# Patient Record
Sex: Male | Born: 1940 | Race: White | Hispanic: No | Marital: Single | Smoking: Former smoker
Health system: Southern US, Community
[De-identification: ages and names within clinical notes are randomized; demographics above are authoritative.]

## PROBLEM LIST (undated history)

## (undated) DIAGNOSIS — I499 Cardiac arrhythmia, unspecified: Secondary | ICD-10-CM

## (undated) DIAGNOSIS — N400 Enlarged prostate without lower urinary tract symptoms: Secondary | ICD-10-CM

## (undated) DIAGNOSIS — K219 Gastro-esophageal reflux disease without esophagitis: Secondary | ICD-10-CM

## (undated) DIAGNOSIS — F32A Depression, unspecified: Secondary | ICD-10-CM

## (undated) DIAGNOSIS — F40240 Claustrophobia: Secondary | ICD-10-CM

## (undated) DIAGNOSIS — H9190 Unspecified hearing loss, unspecified ear: Secondary | ICD-10-CM

## (undated) DIAGNOSIS — F419 Anxiety disorder, unspecified: Secondary | ICD-10-CM

## (undated) DIAGNOSIS — I10 Essential (primary) hypertension: Secondary | ICD-10-CM

## (undated) DIAGNOSIS — F17200 Nicotine dependence, unspecified, uncomplicated: Secondary | ICD-10-CM

## (undated) DIAGNOSIS — E785 Hyperlipidemia, unspecified: Secondary | ICD-10-CM

## (undated) DIAGNOSIS — I739 Peripheral vascular disease, unspecified: Secondary | ICD-10-CM

## (undated) DIAGNOSIS — N529 Male erectile dysfunction, unspecified: Secondary | ICD-10-CM

## (undated) HISTORY — DX: Depression, unspecified: F32.A

## (undated) HISTORY — DX: Benign prostatic hyperplasia without lower urinary tract symptoms: N40.0

## (undated) HISTORY — DX: Nicotine dependence, unspecified, uncomplicated: F17.200

## (undated) HISTORY — DX: Peripheral vascular disease, unspecified: I73.9

## (undated) HISTORY — DX: Hyperlipidemia, unspecified: E78.5

## (undated) HISTORY — DX: Unspecified hearing loss, unspecified ear: H91.90

## (undated) HISTORY — DX: Gastro-esophageal reflux disease without esophagitis: K21.9

## (undated) HISTORY — DX: Anxiety disorder, unspecified: F41.9

## (undated) HISTORY — DX: Male erectile dysfunction, unspecified: N52.9

## (undated) HISTORY — DX: Essential (primary) hypertension: I10

---

## 2019-02-15 DIAGNOSIS — I714 Abdominal aortic aneurysm, without rupture, unspecified: Secondary | ICD-10-CM | POA: Insufficient documentation

## 2021-02-07 DIAGNOSIS — R0602 Shortness of breath: Secondary | ICD-10-CM | POA: Insufficient documentation

## 2021-02-28 DIAGNOSIS — I471 Supraventricular tachycardia: Secondary | ICD-10-CM | POA: Insufficient documentation

## 2021-03-13 ENCOUNTER — Telehealth: Payer: Self-pay

## 2021-03-13 NOTE — Telephone Encounter (Signed)
Notes on file.  

## 2021-03-25 ENCOUNTER — Encounter: Payer: Self-pay | Admitting: Cardiology

## 2021-03-25 ENCOUNTER — Ambulatory Visit (INDEPENDENT_AMBULATORY_CARE_PROVIDER_SITE_OTHER): Payer: Medicare PPO | Admitting: Cardiology

## 2021-03-25 ENCOUNTER — Other Ambulatory Visit: Payer: Self-pay

## 2021-03-25 ENCOUNTER — Ambulatory Visit (INDEPENDENT_AMBULATORY_CARE_PROVIDER_SITE_OTHER): Payer: Medicare PPO

## 2021-03-25 VITALS — BP 184/78 | HR 71 | Ht 68.0 in | Wt 137.0 lb

## 2021-03-25 DIAGNOSIS — I471 Supraventricular tachycardia: Secondary | ICD-10-CM

## 2021-03-25 DIAGNOSIS — R0602 Shortness of breath: Secondary | ICD-10-CM

## 2021-03-25 MED ORDER — FUROSEMIDE 20 MG PO TABS: 20.0000 mg | ORAL_TABLET | Freq: Every day | ORAL | 0 refills | Status: DC

## 2021-03-25 NOTE — Progress Notes (Unsigned)
Patient enrolled for [employer] to mail a 14 day ZIO XT long term holter monitor to address on file. 

## 2021-03-25 NOTE — Progress Notes (Signed)
Electrophysiology Office Note   Date:  03/25/2021   ID:  Pride, Gonzales 09/30/1941, MRN [id number]  PCP:  Marinell Blight, PA-C  Cardiologist:   Primary Electrophysiologist:  Kristian Mogg Jorja Loa, MD    Chief Complaint: SVT   History of Present Illness: Austin Lamb is a 80 y.o. male who is being seen today for the evaluation of SVT at the request of No ref. provider found. Presenting today for electrophysiology evaluation.  He has a history of hypertension, hyperlipidemia, peripheral vascular disease.  He has had multiple episodes of SVT.  He presented emergency room 02/25/2021 with episodes of SVT.    Today, he denies symptoms of palpitations, chest pain,  orthopnea, PND, lower extremity edema, claudication, dizziness, presyncope, syncope, bleeding, or neurologic sequela. The patient is tolerating medications without difficulties.  Since approximately April 1, he has felt quite poorly with fatigue, shortness of breath, and weakness.  He is also has some lower extremity edema.  He does not have PND nor orthopnea.  Prior to this, he was able to do all of his daily activities, working hard outside.  Since then, he has been quite tired and fatigued.  He states that he had pneumonia   Past Medical History:  Diagnosis Date   Anxiety    BPH (benign prostatic hyperplasia)    Depression    ED (erectile dysfunction)    GERD (gastroesophageal reflux disease)    Hearing loss    Hyperlipidemia    Hypertension    PVD (peripheral vascular disease) (HCC)    Tobacco dependence syndrome    Past Surgical History:  Procedure Laterality Date   cardiovasular stress test  11/24/2007   COLONOSCOPY  2007     Current Outpatient Medications  Medication Sig Dispense Refill   albuterol (VENTOLIN HFA) 108 (90 Base) MCG/ACT inhaler Inhale 2 puffs into the lungs every 6 (six) hours as needed for shortness of breath or wheezing.     cilostazol (PLETAL) 100 MG tablet Take 1 tablet by  mouth 2 (two) times daily.     diazepam (VALIUM) 10 MG tablet Take 1 tablet by mouth every 12 (twelve) hours as needed.     furosemide (LASIX) 20 MG tablet Take 1 tablet (20 mg total) by mouth daily. 3 tablet 0   nebivolol (BYSTOLIC) 5 MG tablet Take 1 tablet by mouth daily.     pravastatin (PRAVACHOL) 40 MG tablet Take 1 tablet by mouth daily.     predniSONE (DELTASONE) 20 MG tablet Take two tabs po QAM x 5 days then one tablet po QAM x 5 days     No current facility-administered medications for this visit.    Allergies:   Codeine   Social History:  The patient  reports that he quit smoking about 3 years ago. His smoking use included cigarettes. He has a 3.30 pack-year smoking history. He has never used smokeless tobacco. He reports that he does not drink alcohol and does not use drugs.   Family History:  The patient's family history includes Cancer in his mother; Stroke in his father.    ROS:  Please see the history of present illness.   Otherwise, review of systems is positive for none.   All other systems are reviewed and negative.    PHYSICAL EXAM: VS:  BP (!) 184/78   Pulse 71   Ht 5\' 8"  (1.727 m)   Wt 137 lb (62.1 kg)   BMI 20.83 kg/m  , BMI Body mass  index is 20.83 kg/m. GEN: Well nourished, well developed, in no acute distress  HEENT: normal  Neck: no JVD, carotid bruits, or masses Cardiac: RRR; no murmurs, rubs, or gallops, 1-2+ edema  Respiratory:  clear to auscultation bilaterally, normal work of breathing GI: soft, nontender, nondistended, + BS MS: no deformity or atrophy  Skin: warm and dry Neuro:  Strength and sensation are intact Psych: euthymic mood, full affect  EKG:  EKG is ordered today. Personal review of the ekg ordered shows sinus rhythm, rate 71, anterior Q waves  Recent Labs: No results found for requested labs within last 8760 hours.    Lipid Panel  No results found for: CHOL, TRIG, HDL, CHOLHDL, VLDL, LDLCALC, LDLDIRECT   Wt Readings from  Last 3 Encounters:  03/25/21 137 lb (62.1 kg)      Other studies Reviewed: Additional studies/ records that were reviewed today include: Epic notes  ASSESSMENT AND PLAN:  1.  SVT: Patient had emergency room visits for SVT. All the ECGs we received from his emergency room visit sinus rhythm and sinus tachycardia.  We Haward Pope have him wear a monitor for the next 2 weeks to see if we can diagnose arrhythmia.  2.  Shortness of breath: Patient has been short of breath since April 1.  He apparently had pneumonia around that time.  He has very poor R wave progression in anterior Q waves.  We Yee Joss order a transthoracic echo.  He also has some lower extremity edema.  We Addisynn Vassell order a BMP 20 mg of Lasix to take daily for the next 3 days.  Current medicines are reviewed at length with the patient today.   The patient does not have concerns regarding his medicines.  The following changes were made today: Lasix x3 days  Labs/ tests ordered today include:  Orders Placed This Encounter  Procedures   LONG TERM MONITOR (3-14 DAYS)   EKG 12-Lead   ECHOCARDIOGRAM COMPLETE     Disposition:   FU with Constance Whittle 3 months  Signed, Kalen Ratajczak Jorja Loa, MD  03/25/2021 10:34 AM     [facility] [address] [place] [address] [phone] (office) [phone] (fax)

## 2021-03-25 NOTE — Patient Instructions (Signed)
Medication Instructions:  Your physician has recommended you make the following change in your medication:  TAKE Lasix 20 mg once daily for 3 days, then stop  *If you need a refill on your cardiac medications before your next appointment, please call your pharmacy*   Lab Work: None ordered If you have labs (blood work) drawn today and your tests are completely normal, you will receive your results only by: MyChart Message (if you have MyChart) OR A paper copy in the mail If you have any lab test that is abnormal or we need to change your treatment, we will call you to review the results.   Testing/Procedures: Your physician has requested that you have an echocardiogram. Echocardiography is a painless test that uses sound waves to create images of your heart. It provides your doctor with information about the size and shape of your heart and how well your heart's chambers and valves are working. This procedure takes approximately one hour. There are no restrictions for this procedure.  Your physician has recommended that you wear a 2 week holter monitor. Holter monitors are medical devices that record the heart's electrical activity. Doctors most often use these monitors to diagnose arrhythmias. Arrhythmias are problems with the speed or rhythm of the heartbeat. The monitor is a small, portable device. You can wear one while you do your normal daily activities. This is usually used to diagnose what is causing palpitations/syncope (passing out). This will be mailed to your home address.  Please review instructions below located under "other instructions".   Follow-Up: At [facility], you and your health needs are our priority.  As part of our continuing mission to provide you with exceptional heart care, we have created designated Provider Care Teams.  These Care Teams include your primary Cardiologist (physician) and Advanced Practice Providers (APPs -  Physician Assistants and Nurse  Practitioners) who all work together to provide you with the care you need, when you need it.  We recommend signing up for the patient portal called "MyChart".  Sign up information is provided on this After Visit Summary.  MyChart is used to connect with patients for Virtual Visits (Telemedicine).  Patients are able to view lab/test results, encounter notes, upcoming appointments, etc.  Non-urgent messages can be sent to your provider as well.   To learn more about what you can do with MyChart, go to [email or web address].    Your next appointment:   3 month(s)  The format for your next appointment:   In Person  Provider:   Loman Brooklyn, MD    Thank you for choosing [facility]!!   Dory Horn, RN [phone]   Other Instructions               ZIO XT- Long Term Monitor Instructions  Your physician has requested you wear a ZIO patch monitor for 14 days.  This is a single patch monitor. [employer] supplies one patch monitor per enrollment. Additional stickers are not available. Please do not apply patch if you will be having a Nuclear Stress Test,  Echocardiogram, Cardiac CT, MRI, or Chest Xray during the period you would be wearing the  monitor. The patch cannot be worn during these tests. You cannot remove and re-apply the  ZIO XT patch monitor.  Your ZIO patch monitor will be mailed 3 day USPS to your address on file. It may take 3-5 days  to receive your monitor after you have been enrolled.  Once you have received your monitor,  please review the enclosed instructions. Your monitor  has already been registered assigning a specific monitor serial # to you.  Billing and Patient Assistance Program Information  We have supplied [employer] with any of your insurance information on file for billing purposes. [employer] offers a sliding scale Patient Assistance Program for patients that do not have  insurance, or whose insurance does not completely cover the cost of the ZIO  monitor.  You must apply for the Patient Assistance Program to qualify for this discounted rate.  To apply, please call [employer] at [phone], select option 4, select option 2, ask to apply for  Patient Assistance Program. Meredeth Ide will ask your household income, and how many people  are in your household. They will quote your out-of-pocket cost based on that information.  [employer] will also be able to set up a 20-month, interest-free payment plan if needed.  Applying the monitor   Shave hair from upper left chest.  Hold abrader disc by orange tab. Rub abrader in 40 strokes over the upper left chest as  indicated in your monitor instructions.  Clean area with 4 enclosed alcohol pads. Let dry.  Apply patch as indicated in monitor instructions. Patch will be placed under collarbone on left  side of chest with arrow pointing upward.  Rub patch adhesive wings for 2 minutes. Remove white label marked "1". Remove the white  label marked "2". Rub patch adhesive wings for 2 additional minutes.  While looking in a mirror, press and release button in center of patch. A small green light will  flash 3-4 times. This will be your only indicator that the monitor has been turned on.  Do not shower for the first 24 hours. You may shower after the first 24 hours.  Press the button if you feel a symptom. You will hear a small click. Record Date, Time and  Symptom in the Patient Logbook.  When you are ready to remove the patch, follow instructions on the last 2 pages of Patient  Logbook. Stick patch monitor onto the last page of Patient Logbook.  Place Patient Logbook in the blue and white box. Use locking tab on box and tape box closed  securely. The blue and white box has prepaid postage on it. Please place it in the mailbox as  soon as possible. Your physician should have your test results approximately 7 days after the  monitor has been mailed back to [facility].  Call [facility] Customer Care at  [phone] if you have questions regarding  your ZIO XT patch monitor. Call them immediately if you see an orange light blinking on your  monitor.  If your monitor falls off in less than 4 days, contact our Monitor department at [phone].  If your monitor becomes loose or falls off after 4 days call [employer] at [phone] for  suggestions on securing your monitor

## 2021-03-31 DIAGNOSIS — I471 Supraventricular tachycardia: Secondary | ICD-10-CM | POA: Diagnosis not present

## 2021-04-22 ENCOUNTER — Ambulatory Visit (INDEPENDENT_AMBULATORY_CARE_PROVIDER_SITE_OTHER): Payer: Medicare PPO

## 2021-04-22 ENCOUNTER — Other Ambulatory Visit: Payer: Self-pay

## 2021-04-22 DIAGNOSIS — R0602 Shortness of breath: Secondary | ICD-10-CM

## 2021-04-22 DIAGNOSIS — I471 Supraventricular tachycardia: Secondary | ICD-10-CM | POA: Diagnosis not present

## 2021-04-22 LAB — ECHOCARDIOGRAM COMPLETE
Area-P 1/2: 3.4 cm2
Calc EF: 48 %
S' Lateral: 3.5 cm
Single Plane A2C EF: 50.1 %
Single Plane A4C EF: 46.2 %

## 2021-04-22 NOTE — Progress Notes (Signed)
Complete echocardiogram performed.  Jimmy Miraj Truss RDCS, RVT  

## 2021-06-21 ENCOUNTER — Encounter: Payer: Medicare PPO | Admitting: Thoracic Surgery (Cardiothoracic Vascular Surgery)

## 2021-06-24 ENCOUNTER — Ambulatory Visit: Payer: Medicare PPO | Admitting: Cardiology

## 2021-06-27 ENCOUNTER — Institutional Professional Consult (permissible substitution): Payer: Medicare PPO | Admitting: Thoracic Surgery (Cardiothoracic Vascular Surgery)

## 2021-06-27 ENCOUNTER — Other Ambulatory Visit: Payer: Self-pay | Admitting: *Deleted

## 2021-06-27 ENCOUNTER — Other Ambulatory Visit: Payer: Self-pay

## 2021-06-27 ENCOUNTER — Encounter: Payer: Self-pay | Admitting: Thoracic Surgery (Cardiothoracic Vascular Surgery)

## 2021-06-27 DIAGNOSIS — R911 Solitary pulmonary nodule: Secondary | ICD-10-CM

## 2021-06-27 DIAGNOSIS — R918 Other nonspecific abnormal finding of lung field: Secondary | ICD-10-CM | POA: Diagnosis not present

## 2021-06-27 DIAGNOSIS — Z01818 Encounter for other preprocedural examination: Secondary | ICD-10-CM

## 2021-06-27 NOTE — Progress Notes (Signed)
PCP is Ashley Royalty Cherene Julian, PA-C Referring Provider is Marinell Blight, *  Chief Complaint  Patient presents with   Lung Mass    New patient consultation, Ct chest 8/15, PET 8/23, CTA chest 5/16    HPI: [name] is sent for consultation regarding a left upper lobe lung nodule.  Austin Lamb is an 80 year old man with past history significant for hypertension, hyperlipidemia, tobacco abuse (quit 4 years ago), peripheral vascular disease, reflux, anxiety and depression.  He smoked about a pack a day for 60 years prior to quitting 4 years ago.  Back in the spring he had a respiratory infection.   At one point he was having shortness of breath and he had a CT of the chest done.  It showed a small left upper lobe lung nodule.  A repeat CT was done in August which showed a 10 mm nodule in the left upper lobe.  It was slightly increased in size from his previous scan.  A PET/CT showed the nodule was hypermetabolic with an SUV of 2.  There was no adenopathy or evidence of distant metastasis.  He has been feeling short of breath with exertion.  Prior to his issues this spring he was very active.  He is recently been diagnosed with SVT.  He saw Dr. Elberta Fortis recently.  He has a follow-up appointment with him next week.  He denies any change in appetite or weight loss.  Occasional dry cough.  No wheezing  Zubrod Score: At the time of surgery this patient's most appropriate activity status/level should be described as: []     0    Normal activity, no symptoms [x]     1    Restricted in physical strenuous activity but ambulatory, able to do out light work []     2    Ambulatory and capable of self care, unable to do work activities, up and about >50 % of waking hours                              []     3    Only limited self care, in bed greater than 50% of waking hours []     4    Completely disabled, no self care, confined to bed or chair []     5    Moribund  Past Medical History:  Diagnosis  Date   Anxiety    BPH (benign prostatic hyperplasia)    Depression    ED (erectile dysfunction)    GERD (gastroesophageal reflux disease)    Hearing loss    Hyperlipidemia    Hypertension    PVD (peripheral vascular disease) (HCC)    Tobacco dependence syndrome     Past Surgical History:  Procedure Laterality Date   cardiovasular stress test  11/24/2007   COLONOSCOPY  2007    Family History  Problem Relation Age of Onset   Cancer Mother    Stroke Father     Social History Social History   Tobacco Use   Smoking status: Former    Packs/day: 1.00    Years: 3.30    Pack years: 3.30    Types: Cigarettes    Quit date: 11/2017    Years since quitting: 3.6   Smokeless tobacco: Never  Substance Use Topics   Alcohol use: Never   Drug use: Never    Current Outpatient Medications  Medication Sig Dispense Refill   cilostazol (PLETAL) 100 MG  tablet Take 1 tablet by mouth 2 (two) times daily.     diazepam (VALIUM) 10 MG tablet Take 1 tablet by mouth every 12 (twelve) hours as needed.     nebivolol (BYSTOLIC) 5 MG tablet Take 1 tablet by mouth daily.     pravastatin (PRAVACHOL) 40 MG tablet Take 1 tablet by mouth daily.     No current facility-administered medications for this visit.    Allergies  Allergen Reactions   Codeine Other (See Comments)    Review of Systems  Constitutional:  Positive for activity change. Negative for appetite change, chills, fever and unexpected weight change.  HENT:  Positive for hearing loss. Negative for trouble swallowing and voice change.   Respiratory:  Positive for cough and shortness of breath. Negative for wheezing.   Cardiovascular:  Positive for leg swelling. Negative for chest pain.       Claudication  Gastrointestinal:  Positive for abdominal pain (Reflux).  Genitourinary:  Negative for difficulty urinating and dysuria.  Neurological:  Negative for syncope and weakness.  Psychiatric/Behavioral:  The patient is nervous/anxious  (Claustrophobia).   All other systems reviewed and are negative.  BP (!) 170/68 (BP Location: Right Arm, Patient Position: Sitting, Cuff Size: Normal)   Pulse 65   Ht 5\' 8"  (1.727 m)   Wt 145 lb 8 oz (66 kg)   SpO2 94% Comment: RA  BMI 22.12 kg/m  Physical Exam Vitals reviewed.  Constitutional:      General: He is not in acute distress.    Appearance: Normal appearance.  HENT:     Head: Normocephalic and atraumatic.  Eyes:     Extraocular Movements: Extraocular movements intact.  Neck:     Vascular: No carotid bruit.  Cardiovascular:     Rate and Rhythm: Normal rate and regular rhythm.     Heart sounds: Normal heart sounds. No murmur heard. Pulmonary:     Effort: Pulmonary effort is normal. No respiratory distress.     Breath sounds: Normal breath sounds. No wheezing or rales.  Abdominal:     General: There is no distension.     Palpations: Abdomen is soft.  Musculoskeletal:     Cervical back: Neck supple.  Lymphadenopathy:     Cervical: No cervical adenopathy.  Skin:    General: Skin is warm and dry.  Neurological:     General: No focal deficit present.     Mental Status: He is alert and oriented to person, place, and time.     Cranial Nerves: No cranial nerve deficit.     Diagnostic Tests: Reviewed the CTs and PET/CT which were provided on a disc from [facility]. CT chest 05/27/2021 Irregular bilobed nodule in the left upper lobe slightly larger than previous scan. 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  PET/CT 06/04/2021 Left upper lobe nodule is hypermetabolic.  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  Impression: Austin Lamb is an 80 year old man with past history significant for hypertension, hyperlipidemia, tobacco abuse (quit 4 years ago), peripheral vascular disease, reflux, anxiety and depression.  He smoked about a pack a day for 60 years prior to quitting 4 years ago.  Left upper lobe lung nodule-first  noted in the spring.  On follow-up CT was slightly larger.  Still small lesion.  On PET/CT is metabolically active with an SUV of 2.  That is a pretty significant level of activity for such a small nodule.  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  Of interest of the radiologist noted some evidence of chronic mycobacterial infection.  This could potentially be a small granulomatous nodule.  Because of that I think it is reasonable to try navigational bronchoscopy to biopsy the nodule prior to surgical resection.  Shortness of breath with exertion-Per the patient's daughters he was really very active before he became ill in the spring.  I do not think there is anything from a pulmonary standpoint to explain that progression over such a short period of time.  I suspect cardiac related, likely related to his SVT.  He is being evaluated for that by Dr. Elberta Fortis.  We will await the completion of his evaluation before any intervention on the lung nodule.  Tobacco abuse-quit smoking 4 years ago  I recommended we proceed with navigational bronchoscopy to biopsy the lesion.  He will need a new CT for planning.  I informed him of the general nature of the procedure including the need for general anesthesia.  They understand is an endoscopic procedure that we will do as an outpatient.  There is no guarantee will get a definitive diagnosis.  The main reason to do that is if it is a granuloma we can avoid need for surgical resection.  The nodule is borderline for a wedge resection and likely will need a segmentectomy, so I want to make sure that we need to do the surgery before we proceed with it.  I informed him of the indications, risks, benefits, and alternatives.  They understand the risks include, but not limited to death, MI, blood clots, bleeding, pneumothorax, as well as possibility of other unforeseeable  complications.  We did discuss the possibility of treating with stereotactic radiation versus surgical resection.  He has family prefer surgical resection if possible.  Plan: Follow-up with Dr. Elberta Fortis as planned  pulmonary function testing with and without bronchodilators Navigational bronchoscopy for biopsy and possible fiducial placement   Loreli Slot, MD [facility] [phone]

## 2021-06-28 ENCOUNTER — Other Ambulatory Visit: Payer: Self-pay | Admitting: *Deleted

## 2021-06-28 DIAGNOSIS — R911 Solitary pulmonary nodule: Secondary | ICD-10-CM

## 2021-07-02 ENCOUNTER — Ambulatory Visit: Payer: Medicare PPO | Admitting: Student

## 2021-07-03 NOTE — Progress Notes (Deleted)
PCP:  Marinell Blight, PA-C Primary Cardiologist: None Electrophysiologist: Will Jorja Loa, MD   Austin Lamb is a 80 y.o. male seen today for Will Jorja Loa, MD for {Blank single:19197::"cardiac clearance","post hospital follow up","acute visit due to ***","routine electrophysiology followup"}.  Since {Blank single:19197::"last being seen in our clinic","discharge from hospital"} the patient reports doing ***.  he denies chest pain, palpitations, dyspnea, PND, orthopnea, nausea, vomiting, dizziness, syncope, edema, weight gain, or early satiety.  Past Medical History:  Diagnosis Date   Anxiety    BPH (benign prostatic hyperplasia)    Depression    ED (erectile dysfunction)    GERD (gastroesophageal reflux disease)    Hearing loss    Hyperlipidemia    Hypertension    PVD (peripheral vascular disease) (HCC)    Tobacco dependence syndrome    Past Surgical History:  Procedure Laterality Date   cardiovasular stress test  11/24/2007   COLONOSCOPY  2007    Current Outpatient Medications  Medication Sig Dispense Refill   cilostazol (PLETAL) 100 MG tablet Take 1 tablet by mouth 2 (two) times daily.     diazepam (VALIUM) 10 MG tablet Take 1 tablet by mouth every 12 (twelve) hours as needed.     nebivolol (BYSTOLIC) 5 MG tablet Take 1 tablet by mouth daily.     pravastatin (PRAVACHOL) 40 MG tablet Take 1 tablet by mouth daily.     No current facility-administered medications for this visit.    Allergies  Allergen Reactions   Codeine Other (See Comments)    Social History   Socioeconomic History   Marital status: Single    Spouse name: Not on file   Number of children: Not on file   Years of education: Not on file   Highest education level: Not on file  Occupational History   Not on file  Tobacco Use   Smoking status: Former    Packs/day: 1.00    Years: 3.30    Pack years: 3.30    Types: Cigarettes    Quit date: 11/2017    Years since quitting:  3.6   Smokeless tobacco: Never  Substance and Sexual Activity   Alcohol use: Never   Drug use: Never   Sexual activity: Not Currently  Other Topics Concern   Not on file  Social History Narrative   Not on file   Social Determinants of Health   Financial Resource Strain: Not on file  Food Insecurity: Not on file  Transportation Needs: Not on file  Physical Activity: Not on file  Stress: Not on file  Social Connections: Not on file  Intimate Partner Violence: Not on file     Review of Systems: All other systems reviewed and are otherwise negative except as noted above.  Physical Exam: There were no vitals filed for this visit.  GEN- The patient is well appearing, alert and oriented x 3 today.   HEENT: normocephalic, atraumatic; sclera clear, conjunctiva pink; hearing intact; oropharynx clear; neck supple, no JVP Lymph- no cervical lymphadenopathy Lungs- Clear to ausculation bilaterally, normal work of breathing.  No wheezes, rales, rhonchi Heart- Regular rate and rhythm, no murmurs, rubs or gallops, PMI not laterally displaced GI- soft, non-tender, non-distended, bowel sounds present, no hepatosplenomegaly Extremities- no clubbing, cyanosis, or edema; DP/PT/radial pulses 2+ bilaterally MS- no significant deformity or atrophy Skin- warm and dry, no rash or lesion Psych- euthymic mood, full affect Neuro- strength and sensation are intact  EKG {ACTION; IS/IS TFT:[id number]} ordered. Personal review of  EKG from {Blank single:19197::"today","***"} shows ***  Additional studies reviewed include: Previous EP office notes. ***  Monitor 14 days 6-04/2021 showed NSR, 1% SVE, 1.3% VE, 35 NSVT, longest 11.5 seconds  Assessment and Plan:  1.  SVT:  Previous ECGs showed NSR and Sinus tach Echo 04/22/2021 showed LVEF 50-55%  Patient had emergency room visits for SVT. All the ECGs we received from his emergency room visit sinus rhythm and sinus tachycardia.  We will have him wear a  monitor for the next 2 weeks to see if we can diagnose arrhythmia.   2.  Shortness of breath: Patient has been short of breath since April 1.  He apparently had pneumonia around that time.  He has very poor R wave progression in anterior Q waves.  We will order a transthoracic echo.  He also has some lower extremity edema.  We will order a BMP 20 mg of Lasix to take daily for the next 3 days.  Graciella Freer, PA-C  07/03/21 4:01 PM

## 2021-07-04 NOTE — Pre-Procedure Instructions (Signed)
Surgical Instructions    Your procedure is scheduled on Thursday 07/11/21.   Report to [facility] Main Entrance "A" at 06:00 A.M., then check in with the Admitting office.  Call this number if you have problems the morning of surgery:  [phone]   If you have any questions prior to your surgery date call [phone]: Open Monday-Friday 8am-4pm    Remember:  Do not eat or drink after midnight the night before your surgery     Take these medicines the morning of surgery with A SIP OF WATER   nebivolol (BYSTOLIC)  pravastatin (PRAVACHOL)  diazepam (VALIUM)- If needed  Please follow your surgeon's instructions regarding cilostazol (PLETAL). If you have not received instructions then you need to contact your surgeon's office for instructions.   As of today, STOP taking any Aspirin (unless otherwise instructed by your surgeon) Aleve, Naproxen, Ibuprofen, Motrin, Advil, Goody's, BC's, all herbal medications, fish oil, and all vitamins.          Do not wear jewelry or makeup Do not wear lotions, powders, perfumes/colognes, or deodorant. Do not shave 48 hours prior to surgery.  Men may shave face and neck. Do not bring valuables to the hospital. DO Not wear nail polish, gel polish, artificial nails, or any other type of covering on natural nails including finger and toenails. If patients have artificial nails, gel coating, etc. that need to be removed by a nail salon please have this removed prior to surgery or surgery may need to be canceled/delayed if the surgeon/ anesthesia feels like the patient is unable to be adequately monitored.             [facility] is not responsible for any belongings or valuables.  Do NOT Smoke (Tobacco/Vaping)  24 hours prior to your procedure If you use a CPAP at night, you may bring your mask for your overnight stay.   Contacts, glasses, dentures or bridgework may not be worn into surgery, please bring cases for these belongings   For patients  admitted to the hospital, discharge time will be determined by your treatment team.   Patients discharged the day of surgery will not be allowed to drive home, and someone needs to stay with them for 24 hours.  NO VISITORS WILL BE ALLOWED IN PRE-OP WHERE PATIENTS GET READY FOR SURGERY.  ONLY 1 SUPPORT PERSON MAY BE PRESENT WHILE YOU ARE IN SURGERY.  IF YOU ARE TO BE ADMITTED, ONCE YOU ARE IN YOUR ROOM YOU WILL BE ALLOWED TWO (2) VISITORS.  Minor children may have two parents present. Special consideration for safety and communication needs will be reviewed on a case by case basis.  Special instructions:    Oral Hygiene is also important to reduce your risk of infection.  Remember - BRUSH YOUR TEETH THE MORNING OF SURGERY WITH YOUR REGULAR TOOTHPASTE   [facility]- Preparing For Surgery  Before surgery, you can play an important role. Because skin is not sterile, your skin needs to be as free of germs as possible. You can reduce the number of germs on your skin by washing with CHG (chlorahexidine gluconate) Soap before surgery.  CHG is an antiseptic cleaner which kills germs and bonds with the skin to continue killing germs even after washing.     Please do not use if you have an allergy to CHG or antibacterial soaps. If your skin becomes reddened/irritated stop using the CHG.  Do not shave (including legs and underarms) for at least 48 hours  prior to first CHG shower. It is OK to shave your face.  Please follow these instructions carefully.     Shower the NIGHT BEFORE SURGERY and the MORNING OF SURGERY with CHG Soap.   If you chose to wash your hair, wash your hair first as usual with your normal shampoo. After you shampoo, rinse your hair and body thoroughly to remove the shampoo.  Then [employer] and genitals (private parts) with your normal soap and rinse thoroughly to remove soap.  After that Use CHG Soap as you would any other liquid soap. You can apply CHG directly to the skin and wash  gently with a scrungie or a clean washcloth.   Apply the CHG Soap to your body ONLY FROM THE NECK DOWN.  Do not use on open wounds or open sores. Avoid contact with your eyes, ears, mouth and genitals (private parts). Wash Face and genitals (private parts)  with your normal soap.   Wash thoroughly, paying special attention to the area where your surgery will be performed.  Thoroughly rinse your body with warm water from the neck down.  DO NOT shower/wash with your normal soap after using and rinsing off the CHG Soap.  Pat yourself dry with a CLEAN TOWEL.  Wear CLEAN PAJAMAS to bed the night before surgery  Place CLEAN SHEETS on your bed the night before your surgery  DO NOT SLEEP WITH PETS.   Day of Surgery:  Take a shower with CHG soap. Wear Clean/Comfortable clothing the morning of surgery Do not apply any deodorants/lotions.   Remember to brush your teeth WITH YOUR REGULAR TOOTHPASTE.   Please read over the following fact sheets that you were given.

## 2021-07-05 ENCOUNTER — Ambulatory Visit: Payer: Medicare PPO | Admitting: Student

## 2021-07-05 ENCOUNTER — Inpatient Hospital Stay (HOSPITAL_COMMUNITY)
Admission: RE | Admit: 2021-07-05 | Discharge: 2021-07-05 | Disposition: A | Discharge status: Home / Self Care | Payer: Medicare PPO | Source: Ambulatory Visit

## 2021-07-05 ENCOUNTER — Inpatient Hospital Stay: Admission: RE | Admit: 2021-07-05 | Payer: Medicare PPO | Source: Ambulatory Visit

## 2021-07-05 DIAGNOSIS — R0602 Shortness of breath: Secondary | ICD-10-CM

## 2021-07-05 DIAGNOSIS — I471 Supraventricular tachycardia: Secondary | ICD-10-CM

## 2021-07-08 ENCOUNTER — Inpatient Hospital Stay (HOSPITAL_COMMUNITY): Admission: RE | Admit: 2021-07-08 | Payer: Medicare PPO | Source: Ambulatory Visit

## 2021-07-11 ENCOUNTER — Ambulatory Visit
Admission: RE | Admit: 2021-07-11 | Payer: Medicare PPO | Source: Ambulatory Visit | Admitting: Thoracic Surgery (Cardiothoracic Vascular Surgery)

## 2021-07-11 ENCOUNTER — Encounter: Admission: RE | Payer: Self-pay | Source: Ambulatory Visit

## 2021-07-11 SURGERY — VIDEO BRONCHOSCOPY WITH ENDOBRONCHIAL NAVIGATION
Anesthesia: General

## 2021-07-16 ENCOUNTER — Other Ambulatory Visit: Payer: Self-pay

## 2021-07-16 ENCOUNTER — Telehealth: Payer: Self-pay | Admitting: Radiation Oncology

## 2021-07-16 ENCOUNTER — Ambulatory Visit: Payer: Medicare PPO | Admitting: Thoracic Surgery (Cardiothoracic Vascular Surgery)

## 2021-07-16 VITALS — BP 176/86 | HR 64 | Resp 20 | Ht 68.0 in | Wt 145.0 lb

## 2021-07-16 DIAGNOSIS — R911 Solitary pulmonary nodule: Secondary | ICD-10-CM

## 2021-07-16 NOTE — Telephone Encounter (Signed)
Called patient to schedule consultation. No answer, LVM for return call. 

## 2021-07-16 NOTE — Progress Notes (Signed)
[address]       [place] [address]             [phone]     HPI: Austin Lamb returns for further discussion regarding his left upper lobe lung nodule  Austin Lamb is an 80 year old man with a history of hypertension, hyperlipidemia, tobacco abuse, PAD, reflux, anxiety, and depression.  He has a 60-pack-year history of smoking prior to quitting 4 years ago.  He was found to have a nodule in the left upper lobe on a CT scan back in the spring.  A follow-up CT in August showed the nodule at increased slightly in size.  On PET/CT the nodule was mildly hypermetabolic with an SUV of 2.  There is no evidence of regional or distant metastatic disease.  I saw him in the office on 06/27/2021.  The nodule is not favorable for wedge resection.  We decided to proceed with a navigational bronchoscopy for biopsy prior to making a determination as to whether to proceed with surgery or radiation.    He called and canceled the bronchoscopy.  Past Medical History:  Diagnosis Date   Anxiety    BPH (benign prostatic hyperplasia)    Depression    ED (erectile dysfunction)    GERD (gastroesophageal reflux disease)    Hearing loss    Hyperlipidemia    Hypertension    PVD (peripheral vascular disease) (HCC)    Tobacco dependence syndrome     Current Outpatient Medications  Medication Sig Dispense Refill   cilostazol (PLETAL) 100 MG tablet Take 1 tablet by mouth 2 (two) times daily.     diazepam (VALIUM) 10 MG tablet Take 1 tablet by mouth every 12 (twelve) hours as needed.     nebivolol (BYSTOLIC) 5 MG tablet Take 1 tablet by mouth daily.     pravastatin (PRAVACHOL) 40 MG tablet Take 1 tablet by mouth daily.     No current facility-administered medications for this visit.    Physical Exam BP (!) 176/86 (BP Location: Right Arm, Patient Position: Sitting)   Pulse 64   Resp 20   Ht 5\' 8"  (1.727 m)   Wt 145 lb (65.8 kg)   SpO2 91% Comment: RA  BMI 22.101 kg/m   80 year old man in no acute distress   Impression: Austin Lamb is an 80 year old man with a history of hypertension, hyperlipidemia, tobacco abuse, PAD, reflux, anxiety, and depression.  He was first noted to have a lung nodule in May.  On a follow-up CT in August the nodule had increased in size slightly.  On PET/CT is hypermetabolic with an SUV of 2.  Differential diagnosis includes primary bronchogenic carcinoma as well as infectious and inflammatory nodules.  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  It has to be considered that unless can be proven otherwise.  He does not want to have surgery.  I explained to him that we were planning on to do a biopsy.  After a lengthy discussion, he is agreeable to see radiation oncology.  I did have to repeat myself multiple times and he exhibited relatively poor understanding of the process.  His daughters were present and hopefully can help him with his decision making.  Plan: Referral to radiation oncology.  They can decide if they are willing to treat without a biopsy or whether a biopsy would be necessary.  If the biopsy  is necessary we could schedule him for a navigational bronchoscopy.  He would not necessarily need a return visit prior to that.  However, he would need a CT with super D protocol.  Austin Slot, MD [facility] [phone]

## 2021-07-17 ENCOUNTER — Telehealth: Payer: Self-pay | Admitting: Radiation Oncology

## 2021-07-17 NOTE — Telephone Encounter (Signed)
Second attempt: called patient to schedule consultation. No answer, LVM for return call.

## 2021-07-24 NOTE — Progress Notes (Signed)
Thoracic Location of Tumor / Histology: Left Upper Lobe Lung  Patient presented for follow-up scans for a left upper lobe lung nodule that was discovered in the spring.  PET 06/04/2021: Metabolically active 1.1 cm left upper lobe nodule is concerning for malignancy. Recommend tissue sampling for pathologic diagnosis.  No enlarged or metabolically active lymph nodes in the chest or other evidence to suggest metastatic disease.   CT Chest 05/27/2021: Revisualization of a left upper lobe bilobed nodule again measuring 10 mm and axial view (series 3 image 19). However, on coronal view, additional portions of the nodule has increased in thickness, now measuring 4 mm in thickness where previously 2 mm (series 600 image 17). Pulmonary emphysema.   CTA Chest 02/25/2021: Indeterminate 1 cm nodule with irregular margins in the left upper lobe. Recommend consultation with short-term chest CT follow-up in 3 months to exclude developing mass/neoplasm.   Biopsies of   Tobacco/Marijuana/Snuff/ETOH use: Former Smoker, quit 4 years ago.  Past/Anticipated interventions by cardiothoracic surgery, if any:  Dr. Dorris Fetch 07/16/2021 -The nodule is not favorable for wedge resection. -We decided to proceed with a navigational bronchoscopy for biopsy prior to making a determination as to whether to proceed with surgery or radiation. -He cancelled his bronch. -Differential diagnosis includes primary bronchogenic carcinoma as well as infectious and inflammatory nodules.   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  It has to be considered that unless can be proven otherwise. -He does not want to have surgery.  I explained to him that we were planning on to do a biopsy.  After a lengthy discussion, he is agreeable to see radiation oncology.  -I did have to repeat myself multiple times and he exhibited relatively poor understanding of the  process.  His daughters were present and hopefully can help him with his decision making. -Referral to radiation oncology.  They can decide if they are willing to treat without a biopsy or whether a biopsy would be necessary. -If the biopsy is necessary we could schedule him for a navigational bronchoscopy.  He would not necessarily need a return visit prior to that.  However, he would need a CT with super D protocol.  Past/Anticipated interventions by medical oncology, if any:    Signs/Symptoms Weight changes, if any: Stable Respiratory complaints, if any: No SOB noted recently. Hemoptysis, if any: Has occasional non-productive cough.  Denies hemoptysis Pain issues, if any: Reports occasional left side pain.   SAFETY ISSUES: Prior radiation? No Pacemaker/ICD? No Possible current pregnancy? N/a Is the patient on methotrexate? No  Current Complaints / other details:

## 2021-07-25 ENCOUNTER — Ambulatory Visit
Admission: RE | Admit: 2021-07-25 | Discharge: 2021-07-25 | Disposition: A | Discharge status: Home / Self Care | Payer: Medicare PPO | Source: Ambulatory Visit | Attending: Radiation Oncology | Admitting: Radiation Oncology

## 2021-07-25 ENCOUNTER — Ambulatory Visit
Admission: RE | Admit: 2021-07-25 | Discharge: 2021-07-25 | Disposition: A | Discharge status: Home / Self Care | Payer: Self-pay | Source: Ambulatory Visit | Attending: Radiation Oncology | Admitting: Radiation Oncology

## 2021-07-25 ENCOUNTER — Encounter: Payer: Self-pay | Admitting: Radiation Oncology

## 2021-07-25 ENCOUNTER — Other Ambulatory Visit: Payer: Self-pay

## 2021-07-25 VITALS — BP 178/81 | HR 57 | Temp 97.8°F | Resp 18 | Wt 148.8 lb

## 2021-07-25 DIAGNOSIS — F17210 Nicotine dependence, cigarettes, uncomplicated: Secondary | ICD-10-CM | POA: Diagnosis not present

## 2021-07-25 DIAGNOSIS — R918 Other nonspecific abnormal finding of lung field: Secondary | ICD-10-CM | POA: Insufficient documentation

## 2021-07-25 DIAGNOSIS — K219 Gastro-esophageal reflux disease without esophagitis: Secondary | ICD-10-CM | POA: Insufficient documentation

## 2021-07-25 DIAGNOSIS — E785 Hyperlipidemia, unspecified: Secondary | ICD-10-CM | POA: Diagnosis not present

## 2021-07-25 DIAGNOSIS — N400 Enlarged prostate without lower urinary tract symptoms: Secondary | ICD-10-CM | POA: Diagnosis not present

## 2021-07-25 DIAGNOSIS — Z79899 Other long term (current) drug therapy: Secondary | ICD-10-CM | POA: Diagnosis not present

## 2021-07-25 DIAGNOSIS — Z8673 Personal history of transient ischemic attack (TIA), and cerebral infarction without residual deficits: Secondary | ICD-10-CM | POA: Insufficient documentation

## 2021-07-25 DIAGNOSIS — I10 Essential (primary) hypertension: Secondary | ICD-10-CM | POA: Insufficient documentation

## 2021-07-25 DIAGNOSIS — Z87891 Personal history of nicotine dependence: Secondary | ICD-10-CM | POA: Diagnosis not present

## 2021-07-25 DIAGNOSIS — I739 Peripheral vascular disease, unspecified: Secondary | ICD-10-CM | POA: Insufficient documentation

## 2021-07-25 NOTE — Progress Notes (Signed)
Radiation Oncology         ([phone]) [phone] ________________________________  Name: Austin Lamb        MRN: [id number]  Date of Service: 07/25/2021 DOB: 1941-02-27  [id number], Austin Stabler, PA-C  Austin Lamb, *     REFERRING PHYSICIAN: Melrose Lamb, *   DIAGNOSIS: The encounter diagnosis was Lung mass.   HISTORY OF PRESENT ILLNESS: Austin Lamb is a 80 y.o. male seen at the request of Dr. Roxan Lamb for an enlarging lung nodules, likely early stage lung cancer. The patient was found in the spring of 2022 to have a nodule in the LUL this measured about 8 mm at that time . A repeat scan on 05/27/21 showed the nodule  again without adenopathy. The lesion measured 10 mm and the thickness was 4 mm and had previously been 2 mm. A PET scan on 06/04/21 located the nodule measuring 11 mm with an SUV was 2.1 with blood pool not documented. He met with Dr. Roxan Lamb and he offered biopsy but the patient declined, and it is not a favorable site for wedge resection. He's seen today to discuss the option of stereotactic body radiotherapy.     PREVIOUS RADIATION THERAPY: No   PAST MEDICAL HISTORY:  Past Medical History:  Diagnosis Date   Anxiety    BPH (benign prostatic hyperplasia)    Depression    ED (erectile dysfunction)    GERD (gastroesophageal reflux disease)    Hearing loss    Hyperlipidemia    Hypertension    PVD (peripheral vascular disease) (HCC)    Tobacco dependence syndrome        PAST SURGICAL HISTORY: Past Surgical History:  Procedure Laterality Date   cardiovasular stress test  11/24/2007   COLONOSCOPY  2007     FAMILY HISTORY:  Family History  Problem Relation Age of Onset   Cancer Mother    Stroke Father      SOCIAL HISTORY:  reports that he quit smoking about 3 years ago. His smoking use included cigarettes. He has a 3.30 pack-year smoking history. He has never used smokeless tobacco. He reports that he does not drink alcohol  and does not use drugs. The patient is single and lives in [place], [place]. He is accompanied by his daughter. His second daughter lives near [place]. He is retired from [employer].   ALLERGIES: Codeine   MEDICATIONS:  Current Outpatient Medications  Medication Sig Dispense Refill   cilostazol (PLETAL) 100 MG tablet Take 1 tablet by mouth 2 (two) times daily.     diazepam (VALIUM) 10 MG tablet Take 1 tablet by mouth every 12 (twelve) hours as needed.     nebivolol (BYSTOLIC) 5 MG tablet Take 1 tablet by mouth daily.     pravastatin (PRAVACHOL) 40 MG tablet Take 1 tablet by mouth daily.     No current facility-administered medications for this encounter.     REVIEW OF SYSTEMS: On review of systems, the patient reports that he is doing well overall. He denies any chest pain, shortness of breath, fevers, chills, night sweats, unintended weight changes. He has occasional productive cough with clear or sometimes white mucous without hemoptysis. He denies any new musculoskeletal or joint aches or pains. A complete review of systems is obtained and is otherwise negative.     PHYSICAL EXAM:  Wt Readings from Last 3 Encounters:  07/25/21 148 lb 12.8 oz (67.5 kg)  07/16/21 145 lb (65.8 kg)  06/27/21 145  lb 8 oz (66 kg)   Temp Readings from Last 3 Encounters:  07/25/21 97.8 F (36.6 C)   BP Readings from Last 3 Encounters:  07/25/21 (!) 178/81  07/16/21 (!) 176/86  06/27/21 (!) 170/68   Pulse Readings from Last 3 Encounters:  07/25/21 (!) 57  07/16/21 64  06/27/21 65   Pain Assessment Pain Score: 0-No pain/10  In general this is a well appearing caucasian male in no acute distress. He's alert and oriented x4 and appropriate throughout the examination. Cardiopulmonary assessment is negative for acute distress and he exhibits normal effort.     ECOG = 1  0 - Asymptomatic (Fully active, able to carry on all predisease activities without restriction)  1 -  Symptomatic but completely ambulatory (Restricted in physically strenuous activity but ambulatory and able to carry out work of a light or sedentary nature. For example, light housework, office work)  2 - Symptomatic, <50% in bed during the day (Ambulatory and capable of all self care but unable to carry out any work activities. Up and about more than 50% of waking hours)  3 - Symptomatic, >50% in bed, but not bedbound (Capable of only limited self-care, confined to bed or chair 50% or more of waking hours)  4 - Bedbound (Completely disabled. Cannot carry on any self-care. Totally confined to bed or chair)  5 - Death   Eustace Pen MM, Creech RH, Tormey DC, et al. [phone]). "Toxicity and response criteria of the [facility]". [place] Oncol. 5 (6): 649-55    LABORATORY DATA:  No results found for: WBC, HGB, HCT, MCV, PLT No results found for: NA, K, CL, CO2 No results found for: ALT, AST, GGT, ALKPHOS, BILITOT    RADIOGRAPHY: No results found.     IMPRESSION/PLAN: 1. 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 Dr. Lisbeth Lamb discusses the pathology findings and reviews the nature of pulmonary nodules and early stage lung cancer. Ideally he would have tissue confirmation and he appears well enough to consider this procedure. He is aware that the other options would be to consider short interval follow up versus definitive stereotactic body radiotherapy without tissue confirmation. He is going to think about his options and discuss with his family. We discussed the risks, benefits, short, and long term effects of radiotherapy, as well as the curative intent if he proceeds with SBRT. Dr. Lisbeth Lamb discusses the delivery and logistics of radiotherapy and anticipates a course of 3-5 fractions  radiotherapy. I gave him our number to contact us back with further questions or to let us know once he decides how he'd like to proceed.   In a visit lasting 60 minutes, greater  than 50% of the time was spent face to face discussing the patient's condition, in preparation for the discussion, and coordinating the patient's care.   The above documentation reflects my direct findings during this shared patient visit. Please see the separate note by Dr. Lisbeth Lamb on this date for the remainder of the patient's plan of care.    Carola Rhine, [facility]   **Disclaimer: This note was dictated with voice recognition software. Similar sounding words can inadvertently be transcribed and this note may contain transcription errors which may not have been corrected upon publication of note.**

## 2021-07-30 ENCOUNTER — Telehealth: Payer: Self-pay | Admitting: Radiation Oncology

## 2021-07-30 NOTE — Telephone Encounter (Signed)
The patient's daughter Junious Dresser called me and we discussed that her father would like to proceed with a biopsy with Dr. Dorris Fetch, and if confirmed cancer, proceed with SBRT.

## 2021-07-31 ENCOUNTER — Other Ambulatory Visit: Payer: Self-pay | Admitting: *Deleted

## 2021-07-31 ENCOUNTER — Other Ambulatory Visit: Payer: Self-pay | Admitting: Thoracic Surgery (Cardiothoracic Vascular Surgery)

## 2021-07-31 ENCOUNTER — Encounter: Payer: Self-pay | Admitting: *Deleted

## 2021-07-31 DIAGNOSIS — R911 Solitary pulmonary nodule: Secondary | ICD-10-CM

## 2021-08-28 NOTE — Progress Notes (Addendum)
Surgical Instructions    Your procedure is scheduled on 08/30/21.  Report to [facility] Main Entrance "A" at 12:00 P.M., then check in with the Admitting office.  Call this number if you have problems the morning of surgery:  [phone]   If you have any questions prior to your surgery date call [phone]: Open Monday-Friday 8am-4pm    Remember:  Do not eat after midnight the night before your surgery  You may drink clear liquids until 11:00 the morning of your surgery.   Clear liquids allowed are: Water, Non-Citrus Juices (without pulp), Carbonated Beverages, Clear Tea, Black Coffee ONLY (NO MILK, CREAM OR POWDERED CREAMER of any kind), and Gatorade    Take these medicines the morning of surgery with A SIP OF WATER: nebivolol (BYSTOLIC) pravastatin (PRAVACHOL)  As Needed: diazepam (VALIUM) acetaminophen (TYLENOL)  Follow your surgeon's instructions on when to stop Pletal.  If no instructions were given by your surgeon then you will need to call the office to get those instructions.      As of today, STOP taking any Aspirin (unless otherwise instructed by your surgeon) Aleve, Naproxen, Ibuprofen, Motrin, Advil, Goody's, BC's, all herbal medications, fish oil, and all vitamins.          Do not wear jewelry  Do not wear lotions, powders, colognes, or deodorant. Do not shave 48 hours prior to surgery.  Men may shave face and neck. Do not bring valuables to the hospital.              [facility] is not responsible for any belongings or valuables.  Do NOT Smoke (Tobacco/Vaping)  24 hours prior to your procedure  If you use a CPAP at night, you may bring your mask for your overnight stay.   Contacts, glasses, hearing aids, dentures or partials may not be worn into surgery, please bring cases for these belongings   For patients admitted to the hospital, discharge time will be determined by your treatment team.   Patients discharged the day of surgery will not be allowed  to drive home, and someone needs to stay with them for 24 hours.  NO VISITORS WILL BE ALLOWED IN PRE-OP WHERE PATIENTS ARE PREPPED FOR SURGERY.  ONLY 1 SUPPORT PERSON MAY BE PRESENT IN THE WAITING ROOM WHILE YOU ARE IN SURGERY.  IF YOU ARE TO BE ADMITTED, ONCE YOU ARE IN YOUR ROOM YOU WILL BE ALLOWED TWO (2) VISITORS. 1 (ONE) VISITOR MAY STAY OVERNIGHT BUT MUST ARRIVE TO THE ROOM BY 8pm.  Minor children may have two parents present. Special consideration for safety and communication needs will be reviewed on a case by case basis.  Special instructions:    Oral Hygiene is also important to reduce your risk of infection.  Remember - BRUSH YOUR TEETH THE MORNING OF SURGERY WITH YOUR REGULAR TOOTHPASTE   [facility]- Preparing For Surgery  Before surgery, you can play an important role. Because skin is not sterile, your skin needs to be as free of germs as possible. You can reduce the number of germs on your skin by washing with CHG (chlorahexidine gluconate) Soap before surgery.  CHG is an antiseptic cleaner which kills germs and bonds with the skin to continue killing germs even after washing.     Please do not use if you have an allergy to CHG or antibacterial soaps. If your skin becomes reddened/irritated stop using the CHG.  Do not shave (including legs and underarms) for at least 48 hours prior  to first CHG shower. It is OK to shave your face.  Please follow these instructions carefully.     Shower the NIGHT BEFORE SURGERY and the MORNING OF SURGERY with CHG Soap.   If you chose to wash your hair, wash your hair first as usual with your normal shampoo. After you shampoo, rinse your hair and body thoroughly to remove the shampoo.  Then [employer] and genitals (private parts) with your normal soap and rinse thoroughly to remove soap.  After that Use CHG Soap as you would any other liquid soap. You can apply CHG directly to the skin and wash gently with a scrungie or a clean washcloth.   Apply  the CHG Soap to your body ONLY FROM THE NECK DOWN.  Do not use on open wounds or open sores. Avoid contact with your eyes, ears, mouth and genitals (private parts). Wash Face and genitals (private parts)  with your normal soap.   Wash thoroughly, paying special attention to the area where your surgery will be performed.  Thoroughly rinse your body with warm water from the neck down.  DO NOT shower/wash with your normal soap after using and rinsing off the CHG Soap.  Pat yourself dry with a CLEAN TOWEL.  Wear CLEAN PAJAMAS to bed the night before surgery  Place CLEAN SHEETS on your bed the night before your surgery  DO NOT SLEEP WITH PETS.   Day of Surgery:  Take a shower with CHG soap. Wear Clean/Comfortable clothing the morning of surgery Do not apply any deodorants/lotions.   Remember to brush your teeth WITH YOUR REGULAR TOOTHPASTE.   Please read over the following fact sheets that you were given.

## 2021-08-29 ENCOUNTER — Other Ambulatory Visit: Payer: Medicare PPO

## 2021-08-29 ENCOUNTER — Ambulatory Visit (HOSPITAL_COMMUNITY)
Admission: RE | Admit: 2021-08-29 | Discharge: 2021-08-29 | Disposition: A | Discharge status: Home / Self Care | Payer: Medicare PPO | Source: Ambulatory Visit | Attending: Thoracic Surgery (Cardiothoracic Vascular Surgery) | Admitting: Thoracic Surgery (Cardiothoracic Vascular Surgery)

## 2021-08-29 ENCOUNTER — Encounter (HOSPITAL_COMMUNITY): Payer: Self-pay

## 2021-08-29 ENCOUNTER — Encounter (HOSPITAL_COMMUNITY)
Admission: RE | Admit: 2021-08-29 | Discharge: 2021-08-29 | Disposition: A | Discharge status: Home / Self Care | Payer: Medicare PPO | Source: Ambulatory Visit | Attending: Thoracic Surgery (Cardiothoracic Vascular Surgery) | Admitting: Thoracic Surgery (Cardiothoracic Vascular Surgery)

## 2021-08-29 ENCOUNTER — Ambulatory Visit
Admission: RE | Admit: 2021-08-29 | Discharge: 2021-08-29 | Disposition: A | Discharge status: Home / Self Care | Payer: Medicare PPO | Source: Ambulatory Visit | Attending: Thoracic Surgery (Cardiothoracic Vascular Surgery) | Admitting: Thoracic Surgery (Cardiothoracic Vascular Surgery)

## 2021-08-29 ENCOUNTER — Other Ambulatory Visit: Payer: Self-pay

## 2021-08-29 ENCOUNTER — Ambulatory Visit (HOSPITAL_COMMUNITY): Payer: Medicare PPO | Admitting: Certified Registered"

## 2021-08-29 ENCOUNTER — Ambulatory Visit (HOSPITAL_COMMUNITY): Payer: Medicare PPO | Admitting: Vascular Surgery

## 2021-08-29 VITALS — BP 174/79 | HR 58 | Temp 97.9°F | Resp 17 | Ht 68.0 in | Wt 141.7 lb

## 2021-08-29 DIAGNOSIS — E785 Hyperlipidemia, unspecified: Secondary | ICD-10-CM | POA: Diagnosis not present

## 2021-08-29 DIAGNOSIS — R911 Solitary pulmonary nodule: Secondary | ICD-10-CM

## 2021-08-29 DIAGNOSIS — Z87891 Personal history of nicotine dependence: Secondary | ICD-10-CM | POA: Insufficient documentation

## 2021-08-29 DIAGNOSIS — F419 Anxiety disorder, unspecified: Secondary | ICD-10-CM | POA: Insufficient documentation

## 2021-08-29 DIAGNOSIS — I119 Hypertensive heart disease without heart failure: Secondary | ICD-10-CM | POA: Diagnosis not present

## 2021-08-29 DIAGNOSIS — H9190 Unspecified hearing loss, unspecified ear: Secondary | ICD-10-CM | POA: Insufficient documentation

## 2021-08-29 DIAGNOSIS — I7143 Infrarenal abdominal aortic aneurysm, without rupture: Secondary | ICD-10-CM | POA: Insufficient documentation

## 2021-08-29 DIAGNOSIS — J439 Emphysema, unspecified: Secondary | ICD-10-CM | POA: Insufficient documentation

## 2021-08-29 DIAGNOSIS — Z01818 Encounter for other preprocedural examination: Secondary | ICD-10-CM | POA: Diagnosis not present

## 2021-08-29 DIAGNOSIS — K219 Gastro-esophageal reflux disease without esophagitis: Secondary | ICD-10-CM | POA: Insufficient documentation

## 2021-08-29 DIAGNOSIS — F32A Depression, unspecified: Secondary | ICD-10-CM | POA: Diagnosis not present

## 2021-08-29 DIAGNOSIS — Z20822 Contact with and (suspected) exposure to covid-19: Secondary | ICD-10-CM | POA: Diagnosis not present

## 2021-08-29 DIAGNOSIS — I493 Ventricular premature depolarization: Secondary | ICD-10-CM | POA: Insufficient documentation

## 2021-08-29 DIAGNOSIS — I739 Peripheral vascular disease, unspecified: Secondary | ICD-10-CM | POA: Insufficient documentation

## 2021-08-29 HISTORY — DX: Claustrophobia: F40.240

## 2021-08-29 HISTORY — DX: Cardiac arrhythmia, unspecified: I49.9

## 2021-08-29 LAB — COMPREHENSIVE METABOLIC PANEL
ALT: 13 U/L (ref 0–44)
AST: 19 U/L (ref 15–41)
Albumin: 3.9 g/dL (ref 3.5–5.0)
Alkaline Phosphatase: 93 U/L (ref 38–126)
Anion gap: 10 (ref 5–15)
BUN: 7 mg/dL — ABNORMAL LOW (ref 8–23)
CO2: 28 mmol/L (ref 22–32)
Calcium: 9.4 mg/dL (ref 8.9–10.3)
Chloride: 99 mmol/L (ref 98–111)
Creatinine, Ser: 0.87 mg/dL (ref 0.61–1.24)
GFR, Estimated: >60 mL/min (ref >60)
Glucose, Bld: 105 mg/dL — ABNORMAL HIGH (ref 70–99)
Potassium: 4.3 mmol/L (ref 3.5–5.1)
Sodium: 137 mmol/L (ref 135–145)
Total Bilirubin: 0.5 mg/dL (ref 0.3–1.2)
Total Protein: 7.5 g/dL (ref 6.5–8.1)

## 2021-08-29 LAB — CBC
HCT: 45.3 % (ref 39.0–52.0)
Hemoglobin: 14.8 g/dL (ref 13.0–17.0)
MCH: 28.8 pg (ref 26.0–34.0)
MCHC: 32.7 g/dL (ref 30.0–36.0)
MCV: 88.1 fL (ref 80.0–100.0)
Platelets: 259 10*3/uL (ref 150–400)
RBC: 5.14 MIL/uL (ref 4.22–5.81)
RDW: 13.4 % (ref 11.5–15.5)
WBC: 7.5 10*3/uL (ref 4.0–10.5)
nRBC: 0 % (ref 0.0–0.2)

## 2021-08-29 LAB — PROTIME-INR
INR: 1.1 (ref 0.8–1.2)
Prothrombin Time: 14.4 seconds (ref 11.4–15.2)

## 2021-08-29 LAB — APTT: aPTT: 37 seconds — ABNORMAL HIGH (ref 24–36)

## 2021-08-29 LAB — SARS CORONAVIRUS 2 (TAT 6-24 HRS): SARS Coronavirus 2: NEGATIVE

## 2021-08-29 NOTE — Progress Notes (Addendum)
Surgical Instructions    Your procedure is scheduled on 08/30/21.  Report to [facility] Main Entrance "A" at 12:00 P.M., then check in with the Admitting office.  Call this number if you have problems the morning of surgery:  [phone]   If you have any questions prior to your surgery date call [phone]: Open Monday-Friday 8am-4pm   Remember:  Do not eat after midnight the night before your surgery  You may drink clear liquids until 11:00 the morning of your surgery.   Clear liquids allowed are: Water, Non-Citrus Juices (without pulp), Carbonated Beverages, Clear Tea, Black Coffee ONLY (NO MILK, CREAM OR POWDERED CREAMER of any kind), and Gatorade    Take these medicines the morning of surgery with A SIP OF WATER: nebivolol (BYSTOLIC) pravastatin (PRAVACHOL)  As Needed: diazepam (VALIUM) acetaminophen (TYLENOL)  Follow your surgeon's instructions on when to stop Pletal.  If no instructions were given by your surgeon then you will need to call the office to get those instructions.     As of today, STOP taking any Aspirin (unless otherwise instructed by your surgeon) Aleve, Naproxen, Ibuprofen, Motrin, Advil, Goody's, BC's, all herbal medications, fish oil, and all vitamins.  Do not wear jewelry or makeup Do not wear lotions, powders, perfumes/colognes, or deodorant. Do not shave 48 hours prior to surgery.  Men may shave face and neck. Do not bring valuables to the hospital. DO Not wear nail polish, gel polish, artificial nails, or any other type of covering on natural nails including finger and toenails. If patients have artificial nails, gel coating, etc. that need to be removed by a nail salon, please have this removed prior to surgery or surgery may need to be canceled/delayed if the surgeon/ anesthesia feels like the patient is unable to be adequately monitored.         [facility] is not responsible for any belongings or valuables.  Do NOT Smoke (Tobacco/Vaping)  24  hours prior to your procedure  If you use a CPAP at night, you may bring your mask for your overnight stay.   Contacts, glasses, hearing aids, dentures or partials may not be worn into surgery, please bring cases for these belongings   For patients admitted to the hospital, discharge time will be determined by your treatment team.   Patients discharged the day of surgery will not be allowed to drive home, and someone needs to stay with them for 24 hours.  NO VISITORS WILL BE ALLOWED IN PRE-OP WHERE PATIENTS ARE PREPPED FOR SURGERY.  ONLY 1 SUPPORT PERSON MAY BE PRESENT IN THE WAITING ROOM WHILE YOU ARE IN SURGERY.  IF YOU ARE TO BE ADMITTED, ONCE YOU ARE IN YOUR ROOM YOU WILL BE ALLOWED TWO (2) VISITORS. 1 (ONE) VISITOR MAY STAY OVERNIGHT BUT MUST ARRIVE TO THE ROOM BY 8pm.  Minor children may have two parents present. Special consideration for safety and communication needs will be reviewed on a case by case basis.  Special instructions:    Oral Hygiene is also important to reduce your risk of infection.  Remember - BRUSH YOUR TEETH THE MORNING OF SURGERY WITH YOUR REGULAR TOOTHPASTE   - Preparing For Surgery  Before surgery, you can play an important role. Because skin is not sterile, your skin needs to be as free of germs as possible. You can reduce the number of germs on your skin by washing with CHG (chlorahexidine gluconate) Soap before surgery.  CHG is an antiseptic cleaner which kills germs  and bonds with the skin to continue killing germs even after washing.     Please do not use if you have an allergy to CHG or antibacterial soaps. If your skin becomes reddened/irritated stop using the CHG.  Do not shave (including legs and underarms) for at least 48 hours prior to first CHG shower. It is OK to shave your face.  Please follow these instructions carefully.    Shower the NIGHT BEFORE SURGERY and the MORNING OF SURGERY with CHG Soap.   If you chose to wash your hair,  wash your hair first as usual with your normal shampoo. After you shampoo, rinse your hair and body thoroughly to remove the shampoo.  Then [employer] and genitals (private parts) with your normal soap and rinse thoroughly to remove soap.  After that Use CHG Soap as you would any other liquid soap. You can apply CHG directly to the skin and wash gently with a scrungie or a clean washcloth.   Apply the CHG Soap to your body ONLY FROM THE NECK DOWN.  Do not use on open wounds or open sores. Avoid contact with your eyes, ears, mouth and genitals (private parts). Wash Face and genitals (private parts)  with your normal soap.   Wash thoroughly, paying special attention to the area where your surgery will be performed.  Thoroughly rinse your body with warm water from the neck down.  DO NOT shower/wash with your normal soap after using and rinsing off the CHG Soap.  Pat yourself dry with a CLEAN TOWEL.  Wear CLEAN PAJAMAS to bed the night before surgery  Place CLEAN SHEETS on your bed the night before your surgery  DO NOT SLEEP WITH PETS.   Day of Surgery:  Take a shower with CHG soap as written above. Wear Clean/Comfortable clothing the morning of surgery Do not apply any deodorants/lotions.   Remember to brush your teeth   Do not wear lotions, powders, colognes, or deodorant.  Men may shave face and neck.  Do not bring valuables to the hospital.   Please read over the following fact sheets that you were given.

## 2021-08-29 NOTE — Progress Notes (Signed)
Anesthesia Chart Review:  Case: [id number] Date/Time: 08/30/21 1345   Procedures:      VIDEO BRONCHOSCOPY WITH ENDOBRONCHIAL NAVIGATION     possible PLACEMENT OF FUDUCIAL   Anesthesia type: General   Pre-op diagnosis: LUL LUNG NODULE   Location: [facility] ROOM 11 / [facility] OR   Surgeons: Loreli Slot, MD       DISCUSSION: Patient is an 80 year old male scheduled for the above procedure. He had a LUL lung nodule noted on imaging during 02/25/21 ED visit for dyspnea.  Other history includes former smoker (quit 11/13/17), HTN, HLD, GERD, hearing loss, PVD, AAA (4.2 cm 06/04/21 PET scan), dysrhythmia (non-sustained SVT 02/25/21), anxiety, depression.   - ED visit at [facility] on 02/25/21 for dyspnea, gradually worsened over the past month. Mainly with exertion. Denied exertional chest pain. Symptoms did not resolve after out-patient antibiotics. CXR showed stable left lung base opacity. CTA showed no PE but indeterminate 1 cm lung nodule LUL with 3 month follow-up imaging recommended. He has had episode of SVT at 179 bpm that resolved with Valsalva maneuvers, but another episode while walking that resolved spontaneously. He may have had associated feeling of palpitations. O2 sat dropped while in SVT ("70s as well as the 80s"). O2 sat at rest 93%, but 87% on walk test. He declined admission, and opted to follow-up with PCP. O2 sat improved at 03/07/21 visit and previous hypoxia felt related to recently treated pneumonia. He was referred to EP cardiologist with [facility] in [place] per family's request. Plan for repeat chest imaging in 3 months.   - EP evaluation by Dr. Elberta Fortis on 03/25/21 for SVT on 02/25/21. Patient previously active but developed pneumonia in April and developed fatigue, SOB, and weakness. He received antibiotics and steroid taper. BNP on 02/25/21 was normal at 14.1 and troponin I normal at <0.010. EKGs from ED visit showed SR and ST, anterior Q waves, widened QRS. He  was given a short course of Lasix for some lower extremity edema. Echo and 14 day event monitor ordered. 04/22/21 echo showed LVEF 50-55%, no LV regional wall motion abnormalities, grade 1 DD, normal RVSF, normal PASP, trivial MR. June 2022 event monitor showed HR 50-272, average 69 bpm, < 1% SVT, 1.3% ventricular ectopy, no afib. Dr. Elberta Fortis recommended to continue to monitor.    - He was evaluated by CT surgeon Dr. Dorris Fetch on 06/27/21 for hypermetabolic LUL lung nodule. 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 Because of his dyspnea and recent SVT, Dr. Dorris Fetch did not want to scheduled navigational bronchoscopy until he completed cardiac testing ordered by Dr. Elberta Fortis. Results as discussed above and outlined in CV below. Patient was then scheduled for bronchoscopy on 07/11/21, but he called and cancelled it indicating he did not want to have surgery. Dr. Dorris Fetch discussed with him and his daughters again 07/16/21. Patient referred stereotactic radiation. He was referred to radiation oncology and was seen on 07/25/21. He decided to proceed with bronchoscopy for biopsy and if confirmed cancer, would proceed with SBRT.   Dr. Dorris Fetch classified patient's Zubrod Score as 1:  Restricted in physical strenuous activity but ambulatory, able to do out light work.  On Pletal, last dose 08/29/21. Dr. Dorris Fetch aware. Patient to be advised not to take on day of surgery.   Super D Chest CT and CXR from 08/29/21 are still in process. 08/29/2021 presurgical  COVID-19 test in process. Anesthesia team to evaluate on the day of surgery.    VS: BP (!) 174/79   Pulse (!) 58   Temp 36.6 C   Resp 17   Ht 5\' 8"  (1.727 m)   Wt 64.3 kg   SpO2 97%   BMI 21.55 kg/m    PROVIDERS: , PA-C is PCP in [place], [place] ([facility], see  Care Everywhere) [place], MD is EP cardiologist  Loman [place], MD is EP RAD-ONC   LABS: Labs reviewed: Acceptable for surgery. (all labs ordered are listed, but only abnormal results are displayed)  Labs Reviewed  COMPREHENSIVE METABOLIC PANEL - Abnormal; Notable for the following components:      Result Value   Glucose, Bld 105 (*)    BUN 7 (*)    All other components within normal limits  APTT - Abnormal; Notable for the following components:   aPTT 37 (*)    All other components within normal limits  SARS CORONAVIRUS 2 (TAT 6-24 HRS)  CBC  PROTIME-INR     IMAGES: CT Super D Chest CT 08/29/21: In process.  CXR 08/29/21: In process.  PET Scan 06/04/21 ([facility]): Impression:  1. Metabolically active 1.1 cm left upper lobe nodule is concerning for malignancy. Recommend tissue sampling for pathologic diagnosis.  2. No enlarged or metabolically active lymph nodes in the chest or other evidence to suggest metastatic disease.  3. Colonic diverticulosis without diverticulitis.  4. 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   CT Chest 05/27/21 ([facility]): Impression: 1.  Interval increase in thickness of the left upper lobe bilobed pulmonary nodule, suspicious for primary neoplasm.  2.  Pulmonary emphysema.  3.  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     EKG: 03/25/21 [facility]): Normal sinus rhythm. Anterior Q waves. Left axis deviation.     CV: Echo 04/22/21: IMPRESSIONS   1. Left ventricular ejection fraction, by estimation, is 50 to 55%. The  left ventricle has low normal function. The left ventricle has no regional  wall motion abnormalities. There is mild concentric left ventricular  hypertrophy. Left ventricular  diastolic parameters are consistent with Grade I diastolic dysfunction  (impaired relaxation).   2. Right ventricular systolic function is normal. The right ventricular  size is normal. There is normal pulmonary artery  systolic pressure.   3. The mitral valve is normal in structure. Trivial mitral valve  regurgitation. No evidence of mitral stenosis.   4. The aortic valve is tricuspid. Aortic valve regurgitation is not  visualized. No aortic stenosis is present.   5. The inferior vena cava is normal in size with greater than 50%  Respiratory variability, suggesting right atrial pressure of 3 mmHg.    Cardiac event monitor 03/31/21-04/14/21: Patch Wear Time:  14 days and 0 hours (2022-06-19T13:58:40-0400 to 2022-07-03T13:58:44-0400) Max 272 bpm 12:33am, 06/20 Min 50 bpm 01:47am, 06/25 Avg 69 bpm Less than 1% supraventricular ectopy 1.3% ventricular ectopy Predominant rhythm was sinus rhythm 35 VT runs occurred, fastest 11 beats at 272 bpm; longest VT episode 11.5 seconds at 131 bpm 0% atrial fibrillation noted Triggered events associated with sinus rhythm and occasional PVCs - Reviewed by Dr. 7/25 on 04/26/21, "Triggered events associated with sinus rhythm.  Continue monitoring."  By notes, stress test 11/24/07. No results see in [facility] or Care Everywhere.   Past Medical History:  Diagnosis Date   Anxiety    BPH (benign prostatic hyperplasia)    Claustrophobia  Depression    Dysrhythmia    non-sustained SVT 02/25/21   ED (erectile dysfunction)    GERD (gastroesophageal reflux disease)    Hearing loss    Hyperlipidemia    Hypertension    PVD (peripheral vascular disease) (HCC)     Past Surgical History:  Procedure Laterality Date   cardiovasular stress test  11/24/2007   COLONOSCOPY  2007    MEDICATIONS:  acetaminophen (TYLENOL) 500 MG tablet   cilostazol (PLETAL) 100 MG tablet   diazepam (VALIUM) 10 MG tablet   nebivolol (BYSTOLIC) 5 MG tablet   pravastatin (PRAVACHOL) 40 MG tablet   No current facility-administered medications for this encounter.    Shonna Chock, PA-C Surgical Short Stay/Anesthesiology [facility] Phone [phone] [facility] Phone [phone] 08/29/2021 6:31  PM

## 2021-08-29 NOTE — Anesthesia Preprocedure Evaluation (Deleted)
Anesthesia Evaluation  Patient identified by MRN, date of birth, ID band Patient awake    Reviewed: Allergy & Precautions, NPO status , Patient's Chart, lab work & pertinent test results, reviewed documented beta blocker date and time   Airway Mallampati: I  TM Distance: >3 FB Neck ROM: Full    Dental  (+) Edentulous Lower, Edentulous Upper, Dental Advisory Given   Pulmonary neg shortness of breath, neg sleep apnea, neg COPD, neg recent URI, former smoker,  LUL LUNG NODULE    + decreased breath sounds      Cardiovascular hypertension, Pt. on medications and Pt. on home beta blockers + Peripheral Vascular Disease   Rhythm:Regular  Echo 04/22/21: IMPRESSIONS  1. Left ventricular ejection fraction, by estimation, is 50 to 55%. The  left ventricle has low normal function. The left ventricle has no regional  wall motion abnormalities. There is mild concentric left ventricular  hypertrophy. Left ventricular  diastolic parameters are consistent with Grade I diastolic dysfunction  (impaired relaxation).  2. Right ventricular systolic function is normal. The right ventricular  size is normal. There is normal pulmonary artery systolic pressure.  3. The mitral valve is normal in structure. Trivial mitral valve  regurgitation. No evidence of mitral stenosis.  4. The aortic valve is tricuspid. Aortic valve regurgitation is not  visualized. No aortic stenosis is present.  5. The inferior vena cava is normal in size with greater than 50%  Respiratory variability, suggesting right atrial pressure of 3 mmHg.    Cardiac event monitor 03/31/21-04/14/21: Patch Wear Time: 14 days and 0 hours (2022-06-19T13:58:40-0400 to 2022-07-03T13:58:44-0400) Max 272 bpm 12:33am, 06/20 Min 50 bpm 01:47am, 06/25 Avg 69 bpm Less than 1% supraventricular ectopy 1.3% ventricular ectopy Predominant rhythm was sinus rhythm 35 VT runs occurred, fastest 11  beats at 272 bpm; longest VT episode 11.5 seconds at 131 bpm 0% atrial fibrillation noted Triggered events associated with sinus rhythm and occasional PVCs   Neuro/Psych PSYCHIATRIC DISORDERS Anxiety Depression negative neurological ROS     GI/Hepatic Neg liver ROS, GERD  Controlled,  Endo/Other  negative endocrine ROS  Renal/GU negative Renal ROSLab Results      Component                Value               Date                      CREATININE               0.87                08/29/2021                Musculoskeletal negative musculoskeletal ROS (+)   Abdominal   Peds  Hematology negative hematology ROS (+) Lab Results      Component                Value               Date                      WBC                      7.5                 08/29/2021  HGB                      14.8                08/29/2021                HCT                      45.3                08/29/2021                MCV                      88.1                08/29/2021                PLT                      259                 08/29/2021              Anesthesia Other Findings   Reproductive/Obstetrics                            Anesthesia Physical Anesthesia Plan  ASA: 2  Anesthesia Plan: General   Post-op Pain Management: Minimal or no pain anticipated   Induction: Intravenous  PONV Risk Score and Plan: 2 and Ondansetron and Dexamethasone  Airway Management Planned: Oral ETT  Additional Equipment: None  Intra-op Plan:   Post-operative Plan: Extubation in OR  Informed Consent: I have reviewed the patients History and Physical, chart, labs and discussed the procedure including the risks, benefits and alternatives for the proposed anesthesia with the patient or authorized representative who has indicated his/her understanding and acceptance.     Dental advisory given  Plan Discussed with: Anesthesiologist and CRNA  Anesthesia Plan  Comments: (PAT note written 08/29/2021 by Shonna Chock, PA-C. )       Anesthesia Quick Evaluation

## 2021-08-29 NOTE — Progress Notes (Signed)
PCP - Garnette Gunner,  Cardiologist -   EP- Dr Elberta Fortis- evaluation, patient was in SVT when seen in [facility] in May.  Endocrine-no  Pulm-no  Chest x-ray - 08/29/21  EKG - 03/24/21  Stress Test - 11/2007  ECHO - 04/22/21  Cardiac Cath - no  AICD-no PM-no LOOP-no  Dialysis-no  Sleep Study - no CPAP - no  LABS-CBC, CMP, PT/INR, PTT, Covid test  ASA-no Patient was given no instructions on holding Pletal.  ERAS-no  HA1C-na Fasting Blood Sugar - no Checks Blood Sugar __0___ times a day  Anesthesia-  Pt denies having chest pain, sob, or fever at this time. All instructions explained to the pt, with a verbal understanding of the material. Pt agrees to go over the instructions while at home for a better understanding. Pt also instructed to self quarantine after being tested for COVID-19. The opportunity to ask questions was provided.

## 2021-08-30 ENCOUNTER — Encounter (HOSPITAL_COMMUNITY)
Admission: RE | Disposition: A | Discharge status: Home / Self Care | Payer: Self-pay | Source: Home / Self Care | Attending: Thoracic Surgery (Cardiothoracic Vascular Surgery)

## 2021-08-30 ENCOUNTER — Other Ambulatory Visit: Payer: Self-pay

## 2021-08-30 ENCOUNTER — Encounter (HOSPITAL_COMMUNITY): Payer: Self-pay | Admitting: Thoracic Surgery (Cardiothoracic Vascular Surgery)

## 2021-08-30 ENCOUNTER — Ambulatory Visit (HOSPITAL_COMMUNITY)
Admission: RE | Admit: 2021-08-30 | Discharge: 2021-08-30 | Disposition: A | Discharge status: Home / Self Care | Payer: Medicare PPO | Attending: Thoracic Surgery (Cardiothoracic Vascular Surgery) | Admitting: Thoracic Surgery (Cardiothoracic Vascular Surgery)

## 2021-08-30 ENCOUNTER — Other Ambulatory Visit: Payer: Self-pay | Admitting: *Deleted

## 2021-08-30 DIAGNOSIS — I2510 Atherosclerotic heart disease of native coronary artery without angina pectoris: Secondary | ICD-10-CM | POA: Diagnosis not present

## 2021-08-30 DIAGNOSIS — I700 Atherosclerosis of aorta: Secondary | ICD-10-CM | POA: Diagnosis not present

## 2021-08-30 DIAGNOSIS — R911 Solitary pulmonary nodule: Secondary | ICD-10-CM | POA: Insufficient documentation

## 2021-08-30 DIAGNOSIS — J439 Emphysema, unspecified: Secondary | ICD-10-CM | POA: Diagnosis not present

## 2021-08-30 DIAGNOSIS — Z539 Procedure and treatment not carried out, unspecified reason: Secondary | ICD-10-CM | POA: Diagnosis not present

## 2021-08-30 SURGERY — CANCELLED PROCEDURE

## 2021-08-30 MED ORDER — LACTATED RINGERS IV SOLN: INTRAVENOUS | Status: DC

## 2021-08-30 MED ORDER — CHLORHEXIDINE GLUCONATE 0.12 % MT SOLN
15.0000 mL | Freq: Once | OROMUCOSAL | Status: AC
  Administered 2021-08-30: 15 mL via OROMUCOSAL
  Filled 2021-08-30: qty 15

## 2021-08-30 MED ORDER — PROPOFOL 10 MG/ML IV BOLUS
INTRAVENOUS | Status: AC
  Filled 2021-08-30: qty 20

## 2021-08-30 MED ORDER — NEBIVOLOL HCL 5 MG PO TABS
5.0000 mg | ORAL_TABLET | Freq: Once | ORAL | Status: AC
  Administered 2021-08-30: 5 mg via ORAL
  Filled 2021-08-30: qty 1

## 2021-08-30 MED ORDER — FENTANYL CITRATE (PF) 250 MCG/5ML IJ SOLN
INTRAMUSCULAR | Status: AC
  Filled 2021-08-30: qty 5

## 2021-08-30 MED ORDER — EPINEPHRINE PF 1 MG/ML IJ SOLN: INTRAMUSCULAR | Status: DC | PRN

## 2021-08-30 MED ORDER — ORAL CARE MOUTH RINSE: 15.0000 mL | Freq: Once | OROMUCOSAL | Status: AC

## 2021-08-30 MED ORDER — EPINEPHRINE PF 1 MG/ML IJ SOLN
INTRAMUSCULAR | Status: AC
  Filled 2021-08-30: qty 1

## 2021-08-30 MED ORDER — 0.9 % SODIUM CHLORIDE (POUR BTL) OPTIME: TOPICAL | Status: DC | PRN

## 2021-08-30 NOTE — Progress Notes (Signed)
Pt discharged home with daughters. Per Dr. Dorris Fetch, the surgery is no longer needed. Pt AAOx4, in no apparent distress or pain. Pt escorted out to the lobby, gait steady.

## 2021-08-30 NOTE — H&P (Signed)
      [address]       [place] [address]             [phone]      Mr. Styer presents for ENB today.  When planning on the computer the nodule appeared very small. I obtained his CT form August from my office and nodule is significantly smaller than 3 months ago.  Likely resolving infectious nodule   CT CHEST WITHOUT CONTRAST   TECHNIQUE: Multidetector CT imaging of the chest was performed using thin slice collimation for electromagnetic bronchoscopy planning purposes, without intravenous contrast.   COMPARISON:  PET-CT, 05/15/2021, CT chest, 05/27/2021   FINDINGS: Cardiovascular: Aortic atherosclerosis. Normal heart size. Three-vessel coronary artery calcifications. No pericardial effusion.   Mediastinum/Nodes: No enlarged mediastinal, hilar, or axillary lymph nodes. Thyroid gland, trachea, and esophagus demonstrate no significant findings.   Lungs/Pleura: Moderate centrilobular emphysema. Diffuse bilateral bronchial wall thickening. Spiculated left upper lobe pulmonary nodule is significantly diminished in size, measuring no greater than 0.4 x 0.3 cm, previously 0.7 x 0.7 cm (series 6, image 46). Bandlike scarring and or atelectasis of the posterior lingula (series 6, image 97). No pleural effusion or pneumothorax.   Upper Abdomen: No acute abnormality.   Musculoskeletal: No chest wall mass or suspicious bone lesions identified.   IMPRESSION: 1. Spiculated left upper lobe pulmonary nodule is significantly diminished in size, measuring no greater than 0.4 x 0.3 cm, previously 0.7 x 0.7 cm. Findings are most consistent with resolving infection or inflammation in the absence of treatment. 2. Emphysema and diffuse bilateral bronchial wall thickening. 3. Coronary artery disease.   Aortic Atherosclerosis (ICD10-I70.0) and Emphysema (ICD10-J43.9).     Electronically Signed   By: Jearld Lesch M.D.   On: 08/30/2021 13:53  Will cancel  procedure I will plan to see him back in 3 months with a repeat CT chest Patient and family informed and are in agreement   Viviann Spare C. Dorris Fetch, MD [facility] [phone]

## 2021-09-02 ENCOUNTER — Telehealth: Payer: Self-pay | Admitting: Radiation Oncology

## 2021-09-02 NOTE — Telephone Encounter (Signed)
I called and spoke with the patient's daughter. He went for his bronchoscopy and a CT prior to showed improvement in the nodule from 7 mm down to 4 mm. So Dr. Dorris Fetch feels this is a resolving nodule from infection. He will repeat a CT in 3 months. We will watch for those results but not plan on any radiation at this time given the good findings on the scan. His daughter is in agreement.

## 2021-10-08 ENCOUNTER — Other Ambulatory Visit: Payer: Self-pay | Admitting: Thoracic Surgery (Cardiothoracic Vascular Surgery)

## 2021-10-08 DIAGNOSIS — R911 Solitary pulmonary nodule: Secondary | ICD-10-CM

## 2021-10-22 ENCOUNTER — Other Ambulatory Visit: Payer: Self-pay | Admitting: Thoracic Surgery (Cardiothoracic Vascular Surgery)

## 2021-10-22 DIAGNOSIS — I714 Abdominal aortic aneurysm, without rupture, unspecified: Secondary | ICD-10-CM

## 2021-10-22 DIAGNOSIS — R1907 Generalized intra-abdominal and pelvic swelling, mass and lump: Secondary | ICD-10-CM

## 2021-12-03 ENCOUNTER — Other Ambulatory Visit: Payer: Self-pay

## 2021-12-03 ENCOUNTER — Ambulatory Visit
Admission: RE | Admit: 2021-12-03 | Discharge: 2021-12-03 | Disposition: A | Discharge status: Home / Self Care | Payer: Medicare PPO | Source: Ambulatory Visit | Attending: Thoracic Surgery (Cardiothoracic Vascular Surgery) | Admitting: Thoracic Surgery (Cardiothoracic Vascular Surgery)

## 2021-12-03 ENCOUNTER — Ambulatory Visit: Payer: Medicare PPO | Admitting: Thoracic Surgery (Cardiothoracic Vascular Surgery)

## 2021-12-03 VITALS — BP 178/82 | HR 61 | Resp 20 | Ht 68.0 in | Wt 140.0 lb

## 2021-12-03 DIAGNOSIS — I739 Peripheral vascular disease, unspecified: Secondary | ICD-10-CM | POA: Insufficient documentation

## 2021-12-03 DIAGNOSIS — I10 Essential (primary) hypertension: Secondary | ICD-10-CM | POA: Insufficient documentation

## 2021-12-03 DIAGNOSIS — F411 Generalized anxiety disorder: Secondary | ICD-10-CM | POA: Insufficient documentation

## 2021-12-03 DIAGNOSIS — R911 Solitary pulmonary nodule: Secondary | ICD-10-CM

## 2021-12-03 DIAGNOSIS — I714 Abdominal aortic aneurysm, without rupture, unspecified: Secondary | ICD-10-CM

## 2021-12-03 DIAGNOSIS — E785 Hyperlipidemia, unspecified: Secondary | ICD-10-CM | POA: Insufficient documentation

## 2021-12-03 MED ORDER — IOPAMIDOL (ISOVUE-370) INJECTION 76%
75.0000 mL | Freq: Once | INTRAVENOUS | Status: AC | PRN
  Administered 2021-12-03: 75 mL via INTRAVENOUS

## 2021-12-03 NOTE — Progress Notes (Signed)
[address]       [place] [address]             [phone]     HPI: Austin Lamb returns with a repeat CT for follow-up of his left upper lobe lung nodules  Austin Lamb is an 81 year old man with a history of hypertension, hyperlipidemia, tobacco abuse, reflux, anxiety, PAD, abdominal aortic aneurysm, and depression.  He had a 60-pack-year history of smoking prior to quitting 4 years ago.  In the spring 2020 he had a left upper lobe lung nodule noted on a CT scan.  On follow-up in August the nodule had increased slightly in size.  It was mildly hypermetabolic on PET with an SUV of 2.  He was scheduled for a navigational bronchoscopy in November.  We repeat his CT in super D protocol and the nodule had gotten smaller.  The procedure was canceled and he now returns for follow-up.  The interim since his last visit he has been feeling well.  His daughter is very concerned about an abdominal aortic aneurysm that was noted on CT recently.  Past Medical History:  Diagnosis Date   Anxiety    BPH (benign prostatic hyperplasia)    Claustrophobia    Depression    Dysrhythmia    non-sustained SVT 02/25/21   ED (erectile dysfunction)    GERD (gastroesophageal reflux disease)    Hearing loss    Hyperlipidemia    Hypertension    PVD (peripheral vascular disease) (HCC)     Current Outpatient Medications  Medication Sig Dispense Refill   acetaminophen (TYLENOL) 500 MG tablet Take 500-1,000 mg by mouth every 6 (six) hours as needed for moderate pain.     cilostazol (PLETAL) 100 MG tablet Take 100 mg by mouth 2 (two) times daily.     diazepam (VALIUM) 10 MG tablet Take 10 mg by mouth 2 (two) times daily as needed for anxiety.     nebivolol (BYSTOLIC) 5 MG tablet Take 5 mg by mouth daily.     pravastatin (PRAVACHOL) 40 MG tablet Take 40 mg by mouth daily.     No current facility-administered medications for this visit.    Physical Exam BP (!) 178/82    Pulse 61     Resp 20    Ht 5\' 8"  (1.727 m)    Wt 140 lb (63.5 kg)    SpO2 97% Comment: RA   BMI 21.26 kg/m  81 year old man in no acute distress Alert and oriented x3 with no focal deficits Well-developed well-nourished Lungs diminished but otherwise clear bilaterally Cardiac regular rate and rhythm  Diagnostic Tests: CT CHEST WITHOUT CONTRAST   TECHNIQUE: Multidetector CT imaging of the chest was performed following the standard protocol without IV contrast.   RADIATION DOSE REDUCTION: This exam was performed according to the departmental dose-optimization program which includes automated exposure control, adjustment of the mA and/or kV according to patient size and/or use of iterative reconstruction technique.   COMPARISON:  Chest CT 08/29/2021   FINDINGS: Cardiovascular: Coronary artery calcification and aortic atherosclerotic calcification. Ascending thoracic aorta normal diameter 33 mm.   Mediastinum/Nodes: No axillary or supraclavicular adenopathy. No mediastinal or hilar adenopathy. No pericardial fluid. Esophagus normal.   Lungs/Pleura: The LEFT upper lobe nodule of concern measures 5 mm (image 52/series 8) compared to 6 mm on CT 08/29/2021. Visually nodule appears decreased in size.   There is a second LEFT upper lobe nodule slightly inferior measuring 3 mm (  image 58/8) which is increased from approximately 1 mm on most recent comparison exam. On CT 11/27/2020 this more inferior nodule measured 4 mm.   No additional pulmonary nodules of suspicion.   Centrilobular emphysema the upper lobes.   Upper Abdomen: Limited view of the liver, kidneys, pancreas are unremarkable. Normal adrenal glands.   Musculoskeletal: No aggressive osseous lesion.   IMPRESSION: 1. Nodule of concern in the LEFT upper lobe is decreased size. 2. Second nearby LEFT upper lobe nodule measures larger than most recent prior exam. Recommend continued CT surveillance.     Electronically Signed    By: Genevive Bi M.D.   On: 12/03/2021 12:05 I personally reviewed the CT images.  There has been minimal change in the 2 left upper lobe nodules.  Impression: Austin Lamb is an 81 year old man with a history of hypertension, hyperlipidemia, tobacco abuse, reflux, anxiety, PAD, abdominal aortic aneurysm, and depression.  He had a 60-pack-year history of smoking prior to quitting 4 years ago.  Left upper lobe lung nodules-as large as 9 x 9 mm back in August.  Had planned navigational bronchoscopy but nodule got smaller in the interval.  On today's CT the nodule is smaller still.  There is an adjacent nodule the radiologist feels a slightly larger.  We will continue to follow with a repeat CT in 6 months.  Infrarenal abdominal aortic aneurysm-4 cm on his recent CT.  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  As far as I can tell he is never seen vascular surgery.  We will make a referral to [facility] to follow the AAA.  Plan: Return in 6 months with CT chest Refer to [facility] for follow-up of his infrarenal abdominal aortic aneurysm which measured 4.0 cm  Loreli Slot, MD [facility] [phone]

## 2022-04-17 ENCOUNTER — Other Ambulatory Visit: Payer: Self-pay | Admitting: Thoracic Surgery (Cardiothoracic Vascular Surgery)

## 2022-04-17 DIAGNOSIS — R918 Other nonspecific abnormal finding of lung field: Secondary | ICD-10-CM

## 2022-04-28 NOTE — Progress Notes (Signed)
[facility] [place]  ASSESSMENT / PLAN: Austin Lamb is a 81 y.o. male with a {aorticaneurysms:24839} measuring *** in greatest orthogonal dimension.  A statement from the Joint Council of the American Association for Vascular Surgery and Society for Vascular Surgery estimated the annual rupture risk according to AAA diameter to be the following: {aneurysmrupture:24841}  The patient is *** a candidate for elective repair of the aneurysm to prevent rupture.  I explained the risks / benefits / alternatives to different approaches to aortic reconstruction.   I explained the specific benefits of open repair including improved durability, limited requirement for surveillance, less need for secondary intervention. I explained the specific risks from open repair including higher physiologic stress from aortic cross clamping, higher risk of perioperative complication (including stroke, MI, pneumonia, renal insufficiency and failure, etc.), higher risk of abdominal wall complications, risk of anastomotic pseudoaneurysm.  I explained the specific benefits of endovascular repair including limited physiologic stress and less recovery time, lower risk of serious perioperative complication, zero risk of abdominal wall complication.  I explained the specific risks from endovascular repair including need for lifetime surveillance, risk of large-bore arterial access, risk of requiring secondary intervention to maintain seal or patency. I explained that not all patients are candidates for endovascular repair based on their unique anatomy.  After detailed discussion the patient and I agree that the best option for the patient is ***.   Recommend the following to reduce the risk of major adverse cardiac / limb events.  Complete cessation from all tobacco products. Excellent blood glucose control with goal A1c < 7%. Blood pressure control with goal blood pressure < 140/90  mmHg. Excellent lipid reduction therapy with goal LDL-C <100 mg/dL. Aspirin 81mg  PO QD.  Atorvastatin 40-80mg  PO QD (or other "high intensity" statin therapy). *** The addition of ezetimibe or PCSK9 inhibitors may benefit patients with difficult to control hypercholesterolemia.    CHIEF COMPLAINT: ***  HISTORY OF PRESENT ILLNESS: Austin Lamb is a 81 y.o. male ***  VASCULAR SURGICAL HISTORY: ***  VASCULAR RISK FACTORS: {FINDINGS; POSITIVE NEGATIVE:[phone]} history of stroke / transient ischemic attack. {FINDINGS; POSITIVE NEGATIVE:[phone]} history of coronary artery disease. *** history of PCI. *** history of CABG.  {FINDINGS; POSITIVE NEGATIVE:[phone]} history of diabetes mellitus. Last A1c ***. {FINDINGS; POSITIVE NEGATIVE:[phone]} history of smoking. *** actively smoking. {FINDINGS; POSITIVE NEGATIVE:[phone]} history of hypertension. *** drug regimen with *** control. {FINDINGS; POSITIVE NEGATIVE:[id number]} history of chronic kidney disease.  Last GFR ***. CKD {stage:30421363}. {FINDINGS; POSITIVE NEGATIVE:[phone]} history of chronic obstructive pulmonary disease, treated with ***.  FUNCTIONAL STATUS: ECOG performance status: {findings; ecog performance status:31780} Ambulatory status: {TNHAmbulation:25868}  Past Medical History:  Diagnosis Date   Anxiety    BPH (benign prostatic hyperplasia)    Claustrophobia    Depression    Dysrhythmia    non-sustained SVT 02/25/21   ED (erectile dysfunction)    GERD (gastroesophageal reflux disease)    Hearing loss    Hyperlipidemia    Hypertension    PVD (peripheral vascular disease) (HCC)     Past Surgical History:  Procedure Laterality Date   cardiovasular stress test  11/24/2007   COLONOSCOPY  2007    Family History  Problem Relation Age of Onset   Cancer Mother    Stroke Father     Social History   Socioeconomic History   Marital status: Single    Spouse name: Not on file   Number of  children: Not on file   Years  of education: Not on file   Highest education level: Not on file  Occupational History   Not on file  Tobacco Use   Smoking status: Former    Packs/day: 1.00    Years: 25.00    Total pack years: 25.00    Types: Cigarettes    Quit date: 11/2017    Years since quitting: 4.4   Smokeless tobacco: Never  Substance and Sexual Activity   Alcohol use: Not Currently   Drug use: Never   Sexual activity: Not Currently  Other Topics Concern   Not on file  Social History Narrative   Not on file   Social Determinants of Health   Financial Resource Strain: Not on file  Food Insecurity: Not on file  Transportation Needs: Not on file  Physical Activity: Not on file  Stress: Not on file  Social Connections: Not on file  Intimate Partner Violence: Not on file    Allergies  Allergen Reactions   Codeine Itching and Rash   Penicillins Itching and Rash    Current Outpatient Medications  Medication Sig Dispense Refill   acetaminophen (TYLENOL) 500 MG tablet Take 500-1,000 mg by mouth every 6 (six) hours as needed for moderate pain.     cilostazol (PLETAL) 100 MG tablet Take 100 mg by mouth 2 (two) times daily.     diazepam (VALIUM) 10 MG tablet Take 10 mg by mouth 2 (two) times daily as needed for anxiety.     nebivolol (BYSTOLIC) 5 MG tablet Take 5 mg by mouth daily.     pravastatin (PRAVACHOL) 40 MG tablet Take 40 mg by mouth daily.     No current facility-administered medications for this visit.    PHYSICAL EXAM There were no vitals filed for this visit.  Constitutional: *** appearing. *** distress. Appears *** nourished.  Neurologic: CN ***. *** focal findings. *** sensory loss. Psychiatric: *** Mood and affect symmetric and appropriate. Eyes: *** No icterus. No conjunctival pallor. Ears, nose, throat: *** mucous membranes moist. Midline trachea.  Cardiac: *** rate and rhythm.  Respiratory: *** unlabored. Abdominal: *** soft, non-tender,  non-distended.  Peripheral vascular: *** Extremity: *** edema. *** cyanosis. *** pallor.  Skin: *** gangrene. *** ulceration.  Lymphatic: *** Stemmer's sign. *** palpable lymphadenopathy.    PERTINENT LABORATORY AND RADIOLOGIC DATA  Most recent CBC    Latest Ref Rng & Units 08/29/2021   11:40 AM  CBC  WBC 4.0 - 10.5 K/uL 7.5   Hemoglobin 13.0 - 17.0 g/dL 02.5   Hematocrit 42.7 - 52.0 % 45.3   Platelets 150 - 400 K/uL 259      Most recent CMP    Latest Ref Rng & Units 08/29/2021   11:40 AM  CMP  Glucose 70 - 99 mg/dL 062   BUN 8 - 23 mg/dL 7   Creatinine 3.76 - 2.83 mg/dL 1.51   Sodium 761 - 607 mmol/L 137   Potassium 3.5 - 5.1 mmol/L 4.3   Chloride 98 - 111 mmol/L 99   CO2 22 - 32 mmol/L 28   Calcium 8.9 - 10.3 mg/dL 9.4   Total Protein 6.5 - 8.1 g/dL 7.5   Total Bilirubin 0.3 - 1.2 mg/dL 0.5   Alkaline Phos 38 - 126 U/L 93   AST 15 - 41 U/L 19   ALT 0 - 44 U/L 13     Renal function CrCl cannot be calculated (Patient's most recent lab result is older than the maximum 21 days allowed.).  No  results found for: "HGBA1C"  No results found for: "LDLCALC", "LDLC", "HIRISKLDL", "POCLDL", "LDLDIRECT", "REALLDLC", "TOTLDLC"   Vascular Imaging: ***  Franchon Ketterman N. Lenell Antu, MD [facility] Phone Number: [phone] 04/28/2022 2:27 PM  Total time spent on preparing this encounter including chart review, data review, collecting history, examining the patient, coordinating care for this {tnhtimebilling:26202}  Portions of this report may have been transcribed using voice recognition software.  Every effort has been made to ensure accuracy; however, inadvertent computerized transcription errors may still be present.

## 2022-04-29 ENCOUNTER — Ambulatory Visit: Payer: Medicare PPO | Admitting: Vascular Surgery

## 2022-04-29 ENCOUNTER — Encounter: Payer: Self-pay | Admitting: Vascular Surgery

## 2022-04-29 VITALS — BP 180/82 | HR 59 | Temp 98.5°F | Resp 20 | Ht 68.0 in | Wt 141.0 lb

## 2022-04-29 DIAGNOSIS — I7143 Infrarenal abdominal aortic aneurysm, without rupture: Secondary | ICD-10-CM

## 2022-05-07 ENCOUNTER — Other Ambulatory Visit: Payer: Self-pay

## 2022-05-07 DIAGNOSIS — I7143 Infrarenal abdominal aortic aneurysm, without rupture: Secondary | ICD-10-CM

## 2022-05-27 ENCOUNTER — Encounter: Payer: Self-pay | Admitting: Thoracic Surgery (Cardiothoracic Vascular Surgery)

## 2022-05-27 ENCOUNTER — Ambulatory Visit
Admission: RE | Admit: 2022-05-27 | Discharge: 2022-05-27 | Disposition: A | Discharge status: Home / Self Care | Payer: Medicare PPO | Source: Ambulatory Visit | Attending: Thoracic Surgery (Cardiothoracic Vascular Surgery) | Admitting: Thoracic Surgery (Cardiothoracic Vascular Surgery)

## 2022-05-27 ENCOUNTER — Ambulatory Visit: Payer: Medicare PPO | Admitting: Thoracic Surgery (Cardiothoracic Vascular Surgery)

## 2022-05-27 VITALS — BP 165/61 | HR 67 | Resp 20 | Ht 68.0 in | Wt 145.0 lb

## 2022-05-27 DIAGNOSIS — R918 Other nonspecific abnormal finding of lung field: Secondary | ICD-10-CM

## 2022-05-27 NOTE — Progress Notes (Signed)
[address]       [place] [address]             [phone]    HPI: Austin Lamb returns for follow-up of a left upper lobe lung nodule  Austin Lamb is an 81 year old male with a history of tobacco abuse, hypertension, hyperlipidemia, reflux, anxiety, PAD, AAA, depression, and a left upper lobe lung nodule.  In the spring 2022 he had a CT scan which showed a left upper lobe lung nodule.  A follow-up in August 2022 showed the nodule had increased in size to 9 mm.  It was mildly hypermetabolic on PET with an SUV of 2.  He was scheduled for a navigational bronchoscopy but the CT with super D protocol showed the nodule had gotten smaller.  I saw him back in February.  On CT the nodule was the same to slightly smaller.  In the interim since his last visit he has not had any new issues.  He has some chronic dyspnea that is unchanged.  No fevers, chills, or unusual cough.  Past Medical History:  Diagnosis Date   Anxiety    BPH (benign prostatic hyperplasia)    Claustrophobia    Depression    Dysrhythmia    non-sustained SVT 02/25/21   ED (erectile dysfunction)    GERD (gastroesophageal reflux disease)    Hearing loss    Hyperlipidemia    Hypertension    PVD (peripheral vascular disease) (HCC)     Current Outpatient Medications  Medication Sig Dispense Refill   acetaminophen (TYLENOL) 500 MG tablet Take 500-1,000 mg by mouth every 6 (six) hours as needed for moderate pain.     cilostazol (PLETAL) 100 MG tablet Take 100 mg by mouth 2 (two) times daily.     diazepam (VALIUM) 10 MG tablet Take 10 mg by mouth 2 (two) times daily as needed for anxiety.     nebivolol (BYSTOLIC) 5 MG tablet Take 5 mg by mouth daily.     pravastatin (PRAVACHOL) 40 MG tablet Take 40 mg by mouth daily.     No current facility-administered medications for this visit.    Physical Exam BP (!) 165/61   Pulse 67   Resp 20   Ht 5\' 8"  (1.727 m)   Wt 145 lb (65.8 kg)   SpO2 95%  Comment: RA  BMI 22.05 kg/m  Well-appearing 81 year old man in no acute distress Alert and oriented x3 with no focal deficits Lungs diminished breath sounds bilaterally but otherwise clear with no rales or wheezing Cardiac regular rate and rhythm no murmur No peripheral edema  Diagnostic Tests: CT CHEST WITHOUT CONTRAST   TECHNIQUE: Multidetector CT imaging of the chest was performed following the standard protocol without IV contrast.   RADIATION DOSE REDUCTION: This exam was performed according to the departmental dose-optimization program which includes automated exposure control, adjustment of the mA and/or kV according to patient size and/or use of iterative reconstruction technique.   COMPARISON:  Chest CT 02/29/2023, chest CT 08/29/2021   FINDINGS: Cardiovascular: Normal cardiac size.No pericardial disease.Coronary artery calcifications.Mild atherosclerosis of the thoracic aorta.   Mediastinum/Nodes: No lymphadenopathy.The thyroid is unremarkable.Esophagus is unremarkable.The trachea is unremarkable.   Lungs/Pleura: Stable 5 mm nodule in the left upper lobe (series 8, image 47). Additional left upper lobe nodule measures 5 mm, previously 3 mm (series 8, image 53).Lingular and right middle lobe scarring/bandlike atelectasis.No new suspicious nodules.Unchanged centrilobular and paraseptal emphysema. No pleural effusion. No pneumothorax.  Upper Abdomen: No acute abnormality.   Musculoskeletal: No acute osseous abnormality.No suspicious osseous lesion.   IMPRESSION: Increased size of a left upper lobe nodule measuring 5 mm, previously 3 mm. Recommend continued surveillance with follow-up chest CT in 3-6 months.   Stable additional left upper lobe nodule measuring 5 mm.     Electronically Signed   By: Caprice Renshaw M.D.   On: 05/27/2022 12:30 I personally reviewed the CT images.  Waxing and waning nodules in the left upper lobe.  5 mm or  less.  Impression: Austin Lamb is an 81 year old male with a history of tobacco abuse, hypertension, hyperlipidemia, reflux, anxiety, PAD, AAA, depression, and a left upper lobe lung nodule.  Left upper lobe lung nodules-2 nodules about 5 mm in diameter.  Has had waxing waning nodules over the past couple of years.  We will plan to repeat a CT in 6 months.  History of tobacco abuse-quit in 2019.  Hypertension-he has whitecoat hypertension-his daughter checked his blood pressure at home prior to coming to the visit and his systolic was 110.  Plan: Return in 6 months with CT chest to follow-up lung nodule  Austin Slot, MD [facility] [phone]

## 2022-10-23 ENCOUNTER — Other Ambulatory Visit: Payer: Self-pay | Admitting: Thoracic Surgery (Cardiothoracic Vascular Surgery)

## 2022-10-23 DIAGNOSIS — R918 Other nonspecific abnormal finding of lung field: Secondary | ICD-10-CM

## 2022-12-09 ENCOUNTER — Ambulatory Visit: Payer: Medicare PPO | Admitting: Thoracic Surgery (Cardiothoracic Vascular Surgery)

## 2022-12-09 ENCOUNTER — Ambulatory Visit
Admission: RE | Admit: 2022-12-09 | Discharge: 2022-12-09 | Disposition: A | Discharge status: Home / Self Care | Payer: Medicare PPO | Source: Ambulatory Visit | Attending: Thoracic Surgery (Cardiothoracic Vascular Surgery) | Admitting: Thoracic Surgery (Cardiothoracic Vascular Surgery)

## 2022-12-09 VITALS — BP 167/84 | HR 81 | Resp 18 | Ht 68.0 in | Wt 150.0 lb

## 2022-12-09 DIAGNOSIS — R918 Other nonspecific abnormal finding of lung field: Secondary | ICD-10-CM | POA: Diagnosis not present

## 2022-12-09 NOTE — Progress Notes (Signed)
[address]       [place] [address]             [phone]      HPI: Mr. Austin Lamb returns for follow-up of a left upper lobe lung nodule   Austin Lamb is an 82 year old male with a history of tobacco abuse, hypertension, hyperlipidemia, reflux, anxiety, PAD, AAA, depression, and a left upper lobe lung nodule.   In the spring 2022 he had a CT scan which showed a left upper lobe lung nodule.  A follow-up in August 2022 showed the nodule had increased in size to 9 mm.  It was mildly hypermetabolic on PET with an SUV of 2.  He was scheduled for a navigational bronchoscopy but the CT with super D protocol showed the nodule had gotten smaller.  He has been followed since then.  I last saw him in August.  There were 2 distinct left upper lobe nodules about 5 mm in diameter.  He now returns with a repeat CT.   No respiratory issues since his last visit.  Does complain of some pain along the left costal margin.  Past Medical History:  Diagnosis Date   Anxiety    BPH (benign prostatic hyperplasia)    Claustrophobia    Depression    Dysrhythmia    non-sustained SVT 02/25/21   ED (erectile dysfunction)    GERD (gastroesophageal reflux disease)    Hearing loss    Hyperlipidemia    Hypertension    PVD (peripheral vascular disease) (HCC)      Current Outpatient Medications  Medication Sig Dispense Refill   acetaminophen (TYLENOL) 500 MG tablet Take 500-1,000 mg by mouth every 6 (six) hours as needed for moderate pain.     cilostazol (PLETAL) 100 MG tablet Take 100 mg by mouth 2 (two) times daily.     diazepam (VALIUM) 10 MG tablet Take 10 mg by mouth 2 (two) times daily as needed for anxiety.     nebivolol (BYSTOLIC) 5 MG tablet Take 5 mg by mouth daily.     pravastatin (PRAVACHOL) 40 MG tablet Take 40 mg by mouth daily.     No current facility-administered medications for this visit.    Physical Exam BP (!) 167/84 (BP Location: Right Arm, Patient  Position: Sitting)   Pulse 81   Resp 18   Ht '5\' 8"'$  (1.727 m)   Wt 150 lb (68 kg)   SpO2 94% Comment: RA  BMI 22.62 kg/m  82 year old man in no acute distress Mild bony prominence along lower anterior left rib cage  Diagnostic Tests: CT CHEST WITHOUT CONTRAST   TECHNIQUE: Multidetector CT imaging of the chest was performed following the standard protocol without IV contrast.   RADIATION DOSE REDUCTION: This exam was performed according to the departmental dose-optimization program which includes automated exposure control, adjustment of the mA and/or kV according to patient size and/or use of iterative reconstruction technique.   COMPARISON:  CT chest 05/27/2022.   FINDINGS: Cardiovascular: The heart size is normal. There is no pericardial effusion. Coronary artery calcifications and calcified plaque in the thoracic aorta are again noted.   Mediastinum/Nodes: The thyroid is unremarkable. The esophagus is grossly unremarkable. There is no mediastinal or axillary lymphadenopathy. There is no bulky hilar adenopathy, within the confines of noncontrast technique.   Lungs/Pleura: The trachea and central airways are patent.   Background centrilobular and paraseptal emphysema is unchanged. There is no focal consolidation or pulmonary edema.  There is no pleural effusion or pneumothorax. Bullae in the medial right lower lobe are unchanged.   A small area of nodularity/architectural distortion measuring approximately 4-5 mm in the left upper lobe is unchanged (5-43). The previously seen 5 mm nodule inferomedial to this area on the prior study has resolved consistent with infectious or inflammatory etiology. There are no new or enlarging nodules.   Upper Abdomen: An infrarenal abdominal aortic aneurysm is partially imaged, better evaluated by prior CTA. The imaged portions of the upper abdomen are otherwise unremarkable.   Musculoskeletal: There is no acute osseous abnormality  or suspicious osseous lesion.   IMPRESSION: 1. Previously seen 5 mm nodule in the left upper lobe has resolved, consistent with infectious or inflammatory etiology. No new, enlarging, or suspicious nodules. 2. Unchanged background emphysema. 3. Partially imaged infrarenal abdominal aortic aneurysm common better evaluated by prior CTA. Per the report from the CTA abdomen/pelvis from 12/03/2021, annual follow-up is recommended.     Electronically Signed   By: Valetta Mole M.D.   On: 12/09/2022 13:51 I personally reviewed the CT images.  Small area of parenchymal distortion/scarring in the left upper lobe.  Previously noted 5 mm nodule resolved.  Impression: Austin Lamb is an 82 year old male with a history of tobacco abuse, hypertension, hyperlipidemia, reflux, anxiety, PAD, AAA, depression, and a left upper lobe lung nodule.   Left upper lobe lung nodule(s)-nodule has resolved.  There is still a Austin area of architectural distortion but this is unchanged over the past year and improved from previous.  No features to suggest malignancy.  Nodule has resolved.  No further follow-up is necessary.  Hypertension-whitecoat.  Monitor blood pressure at home  Plan: No further follow-up of lung nodule indicated Follow-up with primary care  Melrose Nakayama, MD [facility] [phone]

## 2022-12-15 NOTE — Progress Notes (Unsigned)
[facility] [place]  ASSESSMENT / PLAN: Austin Lamb is a 82 y.o. 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  I counseled him about the benign nature of this diagnosis and that his risk for rupture is very low (less than 3% over the next year).  We reviewed the rationale for surveillance.  We will plan to see him again in 6 months with a duplex of his abdominal aorta.  To reduce his risk of growth recommend:  Complete cessation from all tobacco products. Blood glucose control with goal A1c < 7%. Blood pressure control with goal blood pressure < 140/90 mmHg. Lipid reduction therapy with goal LDL-C <100 mg/dL. Aspirin '81mg'$  PO QD.  Atorvastatin 40-'80mg'$  PO QD (or other "high intensity" statin therapy).  CHIEF COMPLAINT: Aneurysm  HISTORY OF PRESENT ILLNESS: Austin Lamb is a 82 y.o. male referred to clinic for discussion of infrarenal abdominal aortic aneurysm demonstrated on recent CT scan 12/03/2021.  Patient reports chronic left upper quadrant abdominal pain about his costal margin.  He has no other symptoms of abdominal discomfort.  He has no symptoms referable to his aneurysm.  We spent the bulk of our visit reviewing the natural history of aneurysm disease and the rationale for surveillance.  Past Medical History:  Diagnosis Date   Anxiety    BPH (benign prostatic hyperplasia)    Claustrophobia    Depression    Dysrhythmia    non-sustained SVT 02/25/21   ED (erectile dysfunction)    GERD (gastroesophageal reflux disease)    Hearing loss    Hyperlipidemia    Hypertension    PVD (peripheral vascular disease) ([place])     Past Surgical History:  Procedure Laterality Date   cardiovasular stress test  11/24/2007   COLONOSCOPY  2007    Family History  Problem Relation Age of Onset   Cancer Mother    Stroke Father     Social History   Socioeconomic History   Marital status: Single    Spouse name: Not on file    Number of children: Not on file   Years of education: Not on file   Highest education level: Not on file  Occupational History   Not on file  Tobacco Use   Smoking status: Former    Packs/day: 1.00    Years: 25.00    Total pack years: 25.00    Types: Cigarettes    Quit date: 11/2017    Years since quitting: 5.0   Smokeless tobacco: Never  Vaping Use   Vaping Use: Never used  Substance and Sexual Activity   Alcohol use: Not Currently   Drug use: Never   Sexual activity: Not Currently  Other Topics Concern   Not on file  Social History Narrative   Not on file   Social Determinants of Health   Financial Resource Strain: Not on file  Food Insecurity: Not on file  Transportation Needs: Not on file  Physical Activity: Not on file  Stress: Not on file  Social Connections: Not on file  Intimate Partner Violence: Not on file    Allergies  Allergen Reactions   Codeine Itching and Rash   Penicillins Itching and Rash    Current Outpatient Medications  Medication Sig Dispense Refill   acetaminophen (TYLENOL) 500 MG tablet Take 500-1,000 mg by mouth every 6 (six) hours as needed for moderate pain.     cilostazol (PLETAL) 100 MG tablet Take 100 mg by mouth 2 (  two) times daily.     diazepam (VALIUM) 10 MG tablet Take 10 mg by mouth 2 (two) times daily as needed for anxiety.     nebivolol (BYSTOLIC) 5 MG tablet Take 5 mg by mouth daily.     pravastatin (PRAVACHOL) 40 MG tablet Take 40 mg by mouth daily.     No current facility-administered medications for this visit.    PHYSICAL EXAM There were no vitals filed for this visit.  Well-appearing elderly man in no acute distress Regular rate and rhythm Unlabored breathing Soft, nontender abdomen.  Palpable pulsatile mass in the epigastric abdomen No reproducible tenderness in the left upper quadrant Palpable pedal pulses bilaterally  PERTINENT LABORATORY AND RADIOLOGIC DATA  Most recent CBC    Latest Ref Rng & Units  08/29/2021   11:40 AM  CBC  WBC 4.0 - 10.5 K/uL 7.5   Hemoglobin 13.0 - 17.0 g/dL 14.8   Hematocrit 39.0 - 52.0 % 45.3   Platelets 150 - 400 K/uL 259      Most recent CMP    Latest Ref Rng & Units 08/29/2021   11:40 AM  CMP  Glucose 70 - 99 mg/dL 105   BUN 8 - 23 mg/dL 7   Creatinine 0.61 - 1.24 mg/dL 0.87   Sodium 135 - 145 mmol/L 137   Potassium 3.5 - 5.1 mmol/L 4.3   Chloride 98 - 111 mmol/L 99   CO2 22 - 32 mmol/L 28   Calcium 8.9 - 10.3 mg/dL 9.4   Total Protein 6.5 - 8.1 g/dL 7.5   Total Bilirubin 0.3 - 1.2 mg/dL 0.5   Alkaline Phos 38 - 126 U/L 93   AST 15 - 41 U/L 19   ALT 0 - 44 U/L 13    CT angiogram 12/03/2021 personally reviewed Small infrarenal abdominal aortic aneurysm measuring about 40 mm  Austin Lamb. Stanford Breed, MD [facility] Phone Number: [phone] 12/15/2022 8:48 PM  Total time spent on preparing this encounter including chart review, data review, collecting history, examining the patient, coordinating care for this established patient, ***.  Portions of this report may have been transcribed using voice recognition software.  Every effort has been made to ensure accuracy; however, inadvertent computerized transcription errors may still be present.

## 2022-12-16 ENCOUNTER — Ambulatory Visit (HOSPITAL_COMMUNITY)
Admission: RE | Admit: 2022-12-16 | Discharge: 2022-12-16 | Disposition: A | Discharge status: Home / Self Care | Payer: Medicare PPO | Source: Ambulatory Visit | Attending: Vascular Surgery | Admitting: Vascular Surgery

## 2022-12-16 ENCOUNTER — Ambulatory Visit: Payer: Medicare PPO | Admitting: Vascular Surgery

## 2022-12-16 ENCOUNTER — Encounter: Payer: Self-pay | Admitting: Vascular Surgery

## 2022-12-16 VITALS — BP 173/82 | HR 58 | Temp 98.2°F | Resp 20 | Ht 68.0 in | Wt 148.0 lb

## 2022-12-16 DIAGNOSIS — I7143 Infrarenal abdominal aortic aneurysm, without rupture: Secondary | ICD-10-CM

## 2023-02-09 IMAGING — CT CT CHEST SUPER D W/O CM
1 of 2 series · 15 of 31 positions shown, 19 images · non-contrast
Comparison: PET-CT, 05/15/2021, CT chest, 05/27/2021

CLINICAL DATA: Left upper lobe pulmonary nodule

EXAM:
CT CHEST WITHOUT CONTRAST
TECHNIQUE: Multidetector CT imaging of the chest was performed using thin slice
collimation for electromagnetic bronchoscopy planning purposes,
without intravenous contrast.

[Series 7: super d · axial · 0.66mm/px · z∈[-355,-36]mm · 15 of 447 slices shown, 19 images]
[im 24/447  mediastinal]
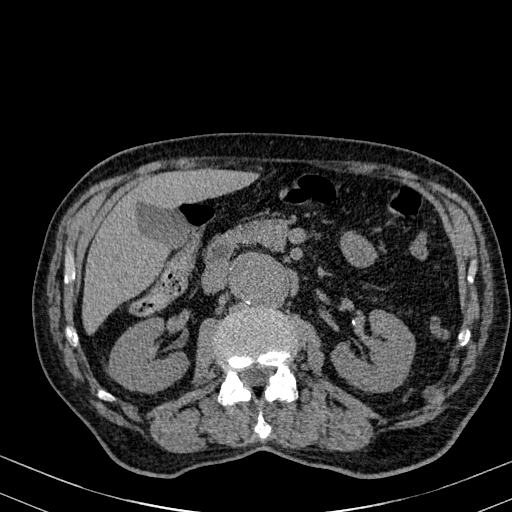
[im 24/447  lung]
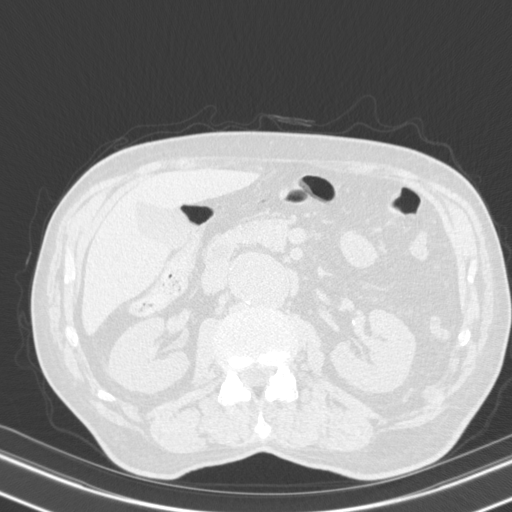
[im 71/447  lung]
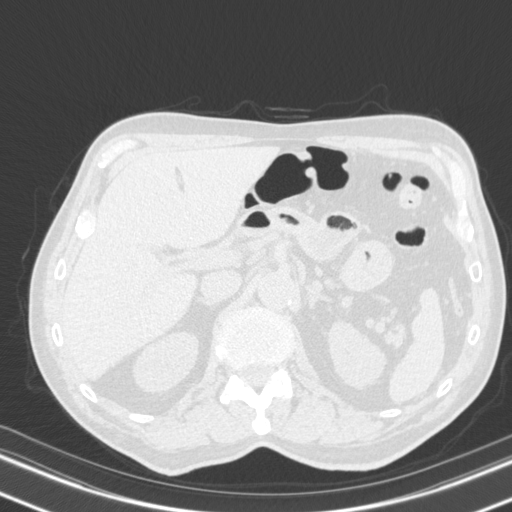
[im 94/447  lung]
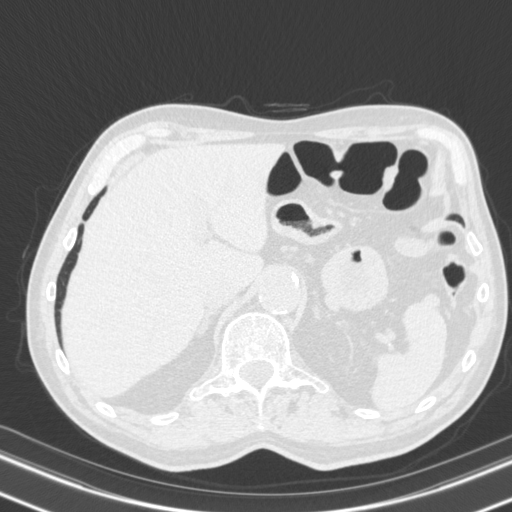
[im 118/447  lung]
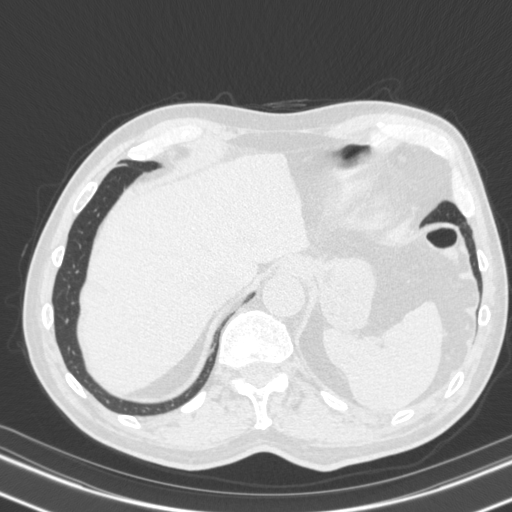
[im 149/447  mediastinal]
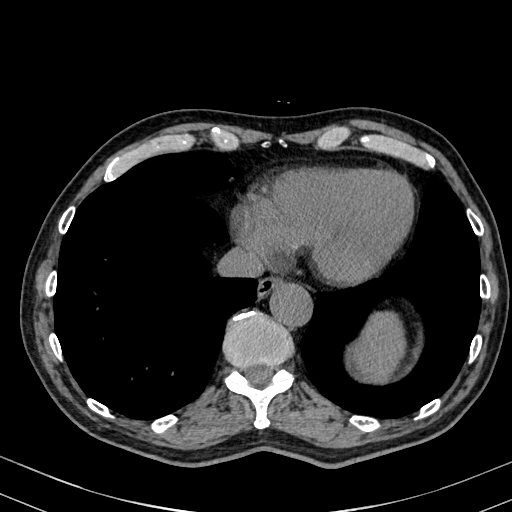
[im 149/447  lung]
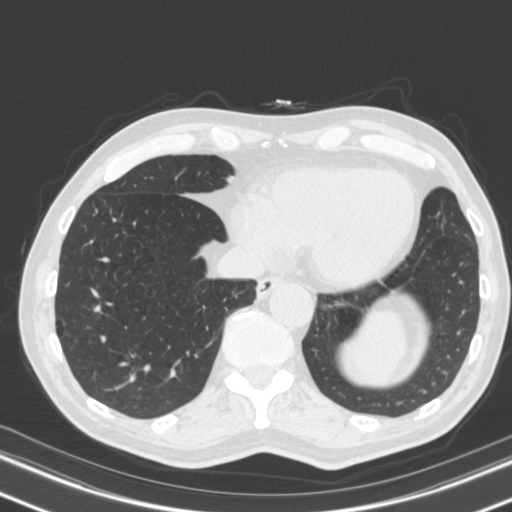
[im 165/447  lung]
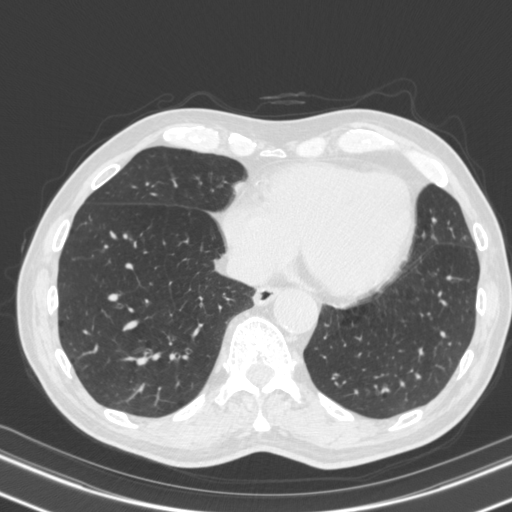
[im 188/447  lung]
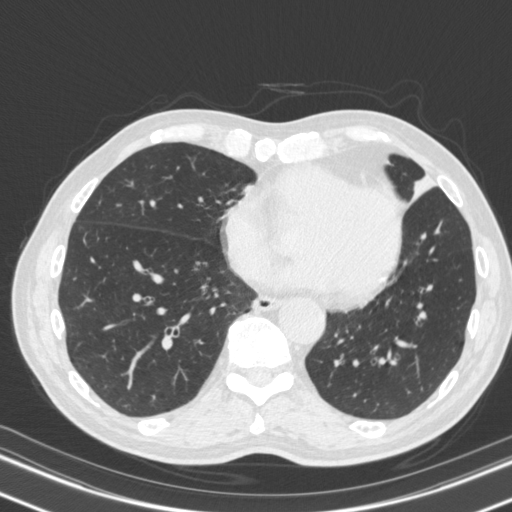
[im 235/447  lung]
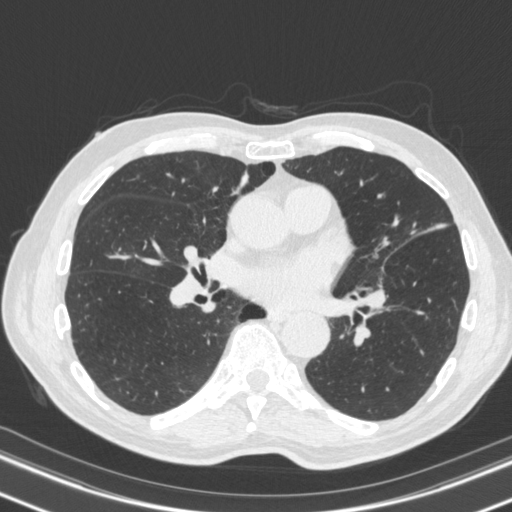
[im 259/447  mediastinal]
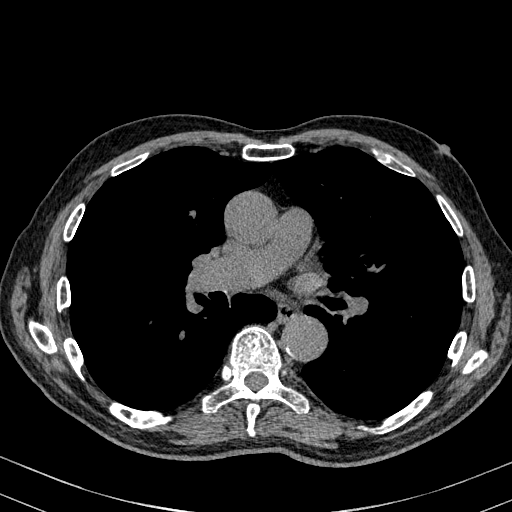
[im 259/447  lung]
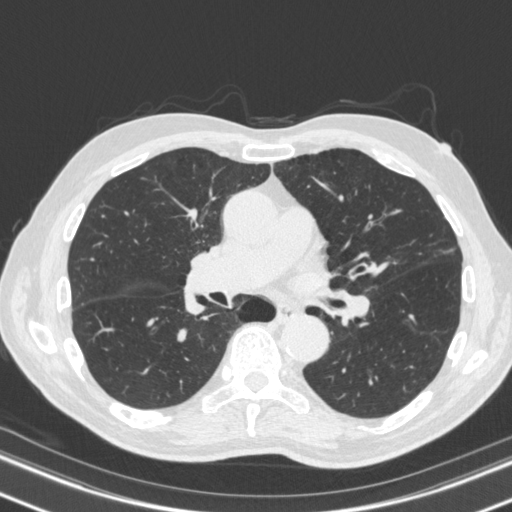
[im 282/447  lung]
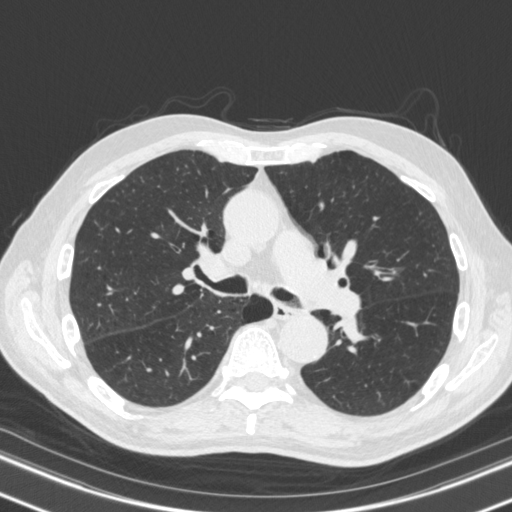
[im 306/447  lung]
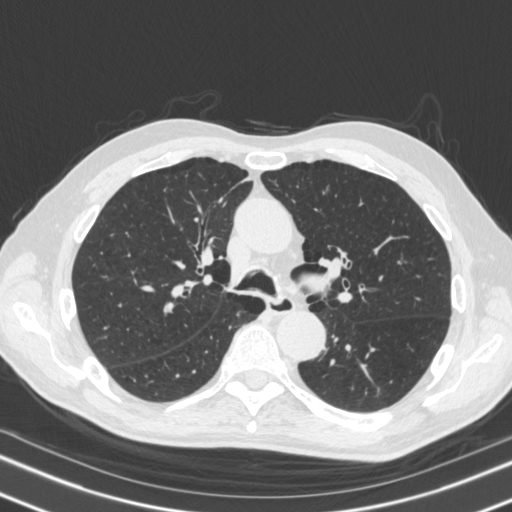
[im 329/447  lung]
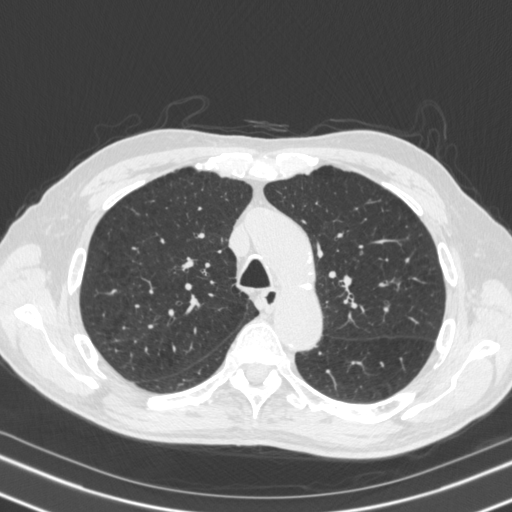
[im 353/447  mediastinal]
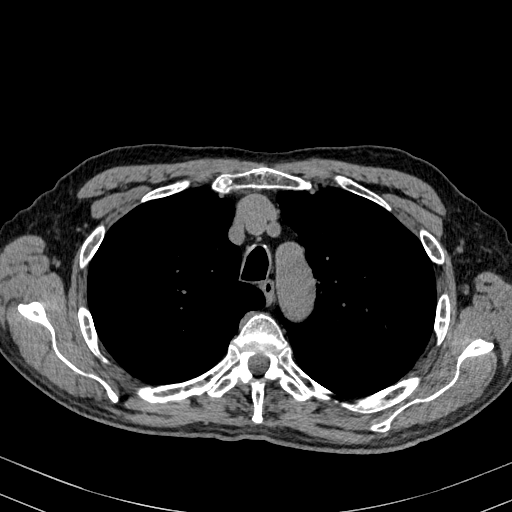
[im 353/447  lung]
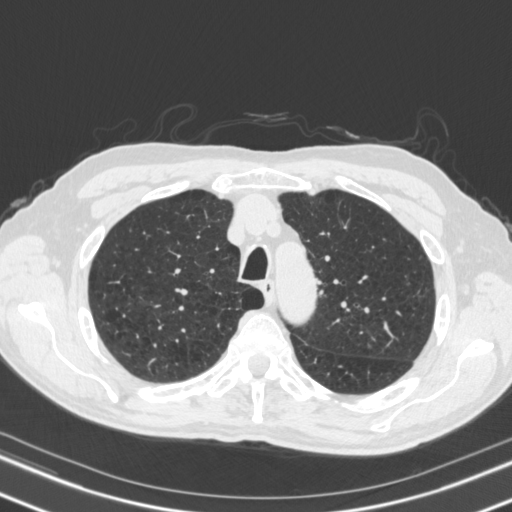
[im 400/447  lung]
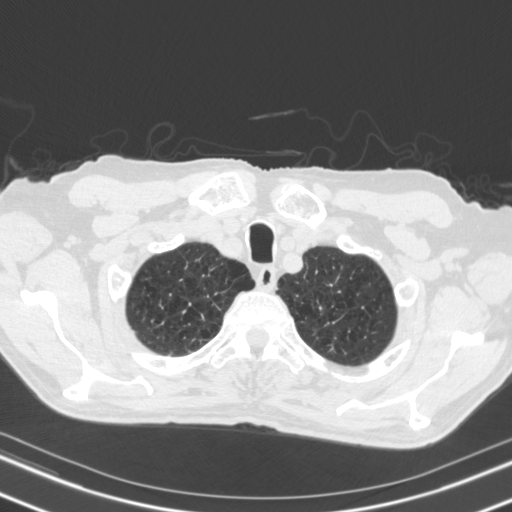
[im 423/447  lung]
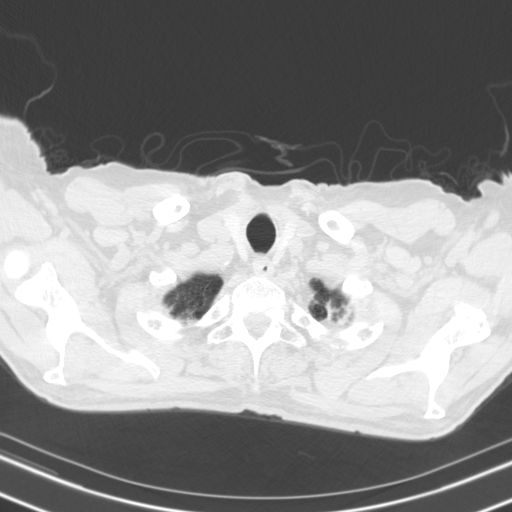

[15 of 31 positions shown; findings below may reference images not displayed]

FINDINGS: Cardiovascular: Aortic atherosclerosis. Normal heart size.
Three-vessel coronary artery calcifications. No pericardial
effusion.

Mediastinum/Nodes: No enlarged mediastinal, hilar, or axillary lymph
nodes. Thyroid gland, trachea, and esophagus demonstrate no
significant findings.

Lungs/Pleura: Moderate centrilobular emphysema. Diffuse bilateral
bronchial wall thickening. Spiculated left upper lobe pulmonary
nodule is significantly diminished in size, measuring no greater
than 0.4 x 0.3 cm, previously 0.7 x 0.7 cm (series 6, image 46).
Bandlike scarring and or atelectasis of the posterior lingula
(series 6, image 97). No pleural effusion or pneumothorax.

Upper Abdomen: No acute abnormality.

Musculoskeletal: No chest wall mass or suspicious bone lesions
identified.
IMPRESSION: 1. Spiculated left upper lobe pulmonary nodule is significantly
diminished in size, measuring no greater than 0.4 x 0.3 cm,
previously 0.7 x 0.7 cm. Findings are most consistent with resolving
infection or inflammation in the absence of treatment.
2. Emphysema and diffuse bilateral bronchial wall thickening.
3. Coronary artery disease.

Aortic Atherosclerosis (K53XH-0HF.F) and Emphysema (K53XH-D9F.W).

## 2023-05-16 IMAGING — CT CT CTA ABD/PEL W/CM AND/OR W/O CM
2 of 8 series · 14 of 46 positions shown, 16 images · IV contrast (agent unspecified)
Comparison: CT chest from 02/25/2021

CLINICAL DATA: 80-year-old male with suspected abdominal aortic
aneurysm.

EXAM:
CT ANGIOGRAPHY ABDOMEN AND PELVIS WITH CONTRAST AND WITHOUT CONTRAST
TECHNIQUE: Multidetector CT imaging of the abdomen and pelvis was performed
using the standard protocol during bolus administration of
intravenous contrast. Multiplanar reconstructed images and MIPs were
obtained and reviewed to evaluate the vascular anatomy.

[Series 5: cta arterial 2.00 bv36 s3 axial st · axial · arterial · 0.69mm/px · z∈[+1264,+1658]mm · 11 of 217 slices shown, 13 images]
[im 10/217  soft-tissue]
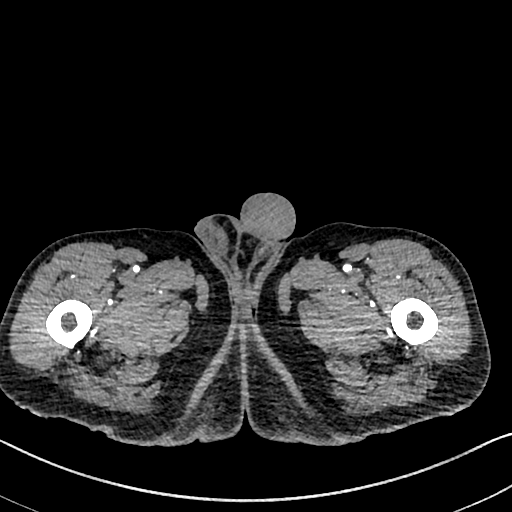
[im 10/217  bone]
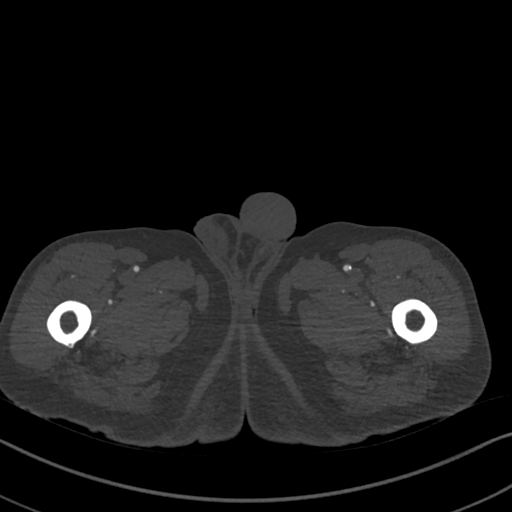
[im 30/217  soft-tissue]
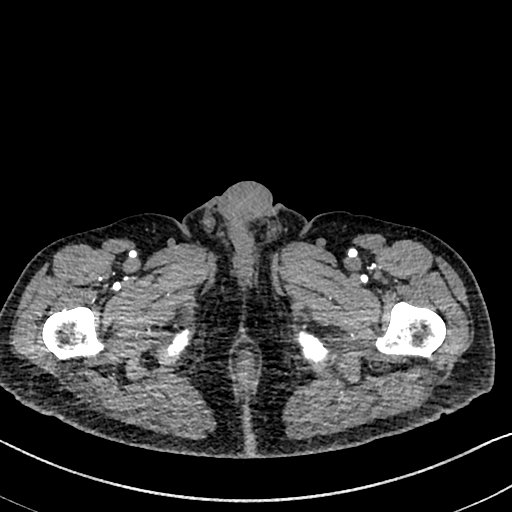
[im 50/217  soft-tissue]
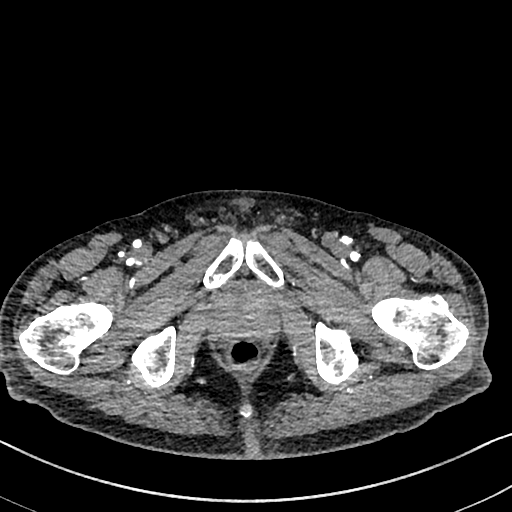
[im 69/217  soft-tissue]
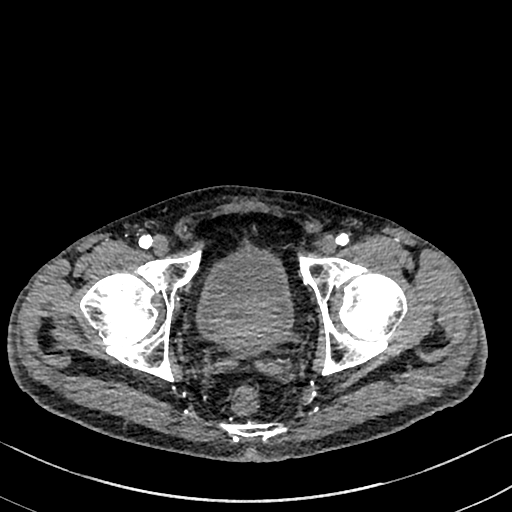
[im 89/217  soft-tissue]
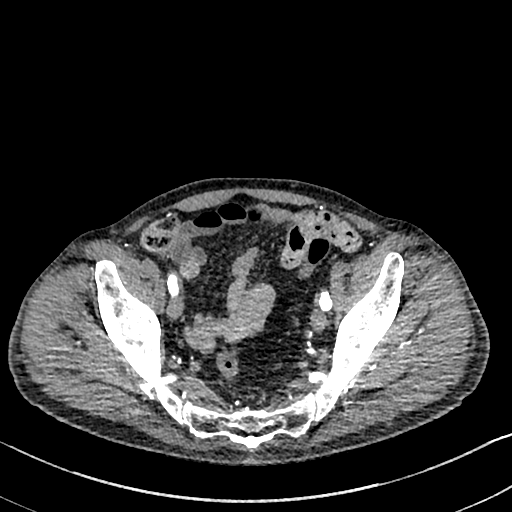
[im 109/217  soft-tissue]
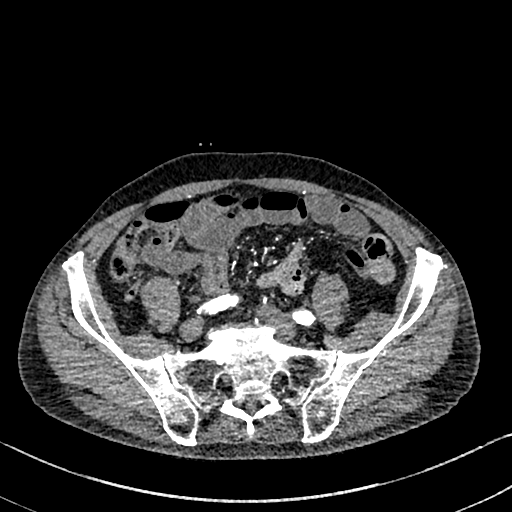
[im 128/217  soft-tissue]
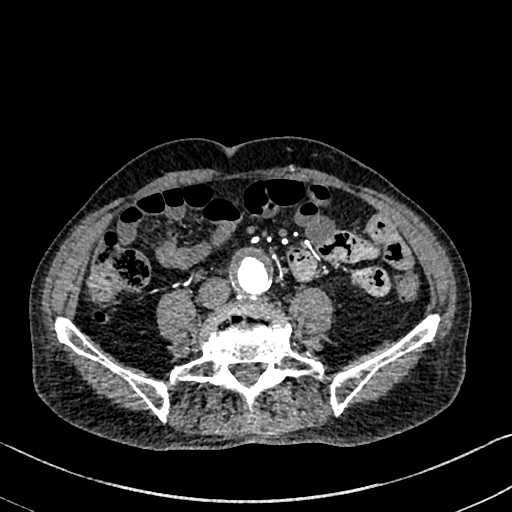
[im 148/217  soft-tissue]
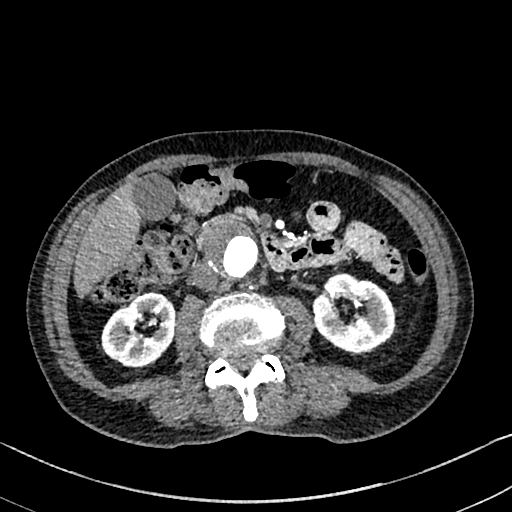
[im 167/217  soft-tissue]
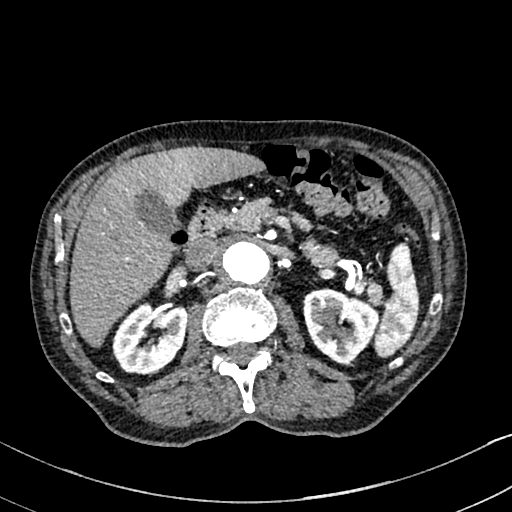
[im 167/217  bone]
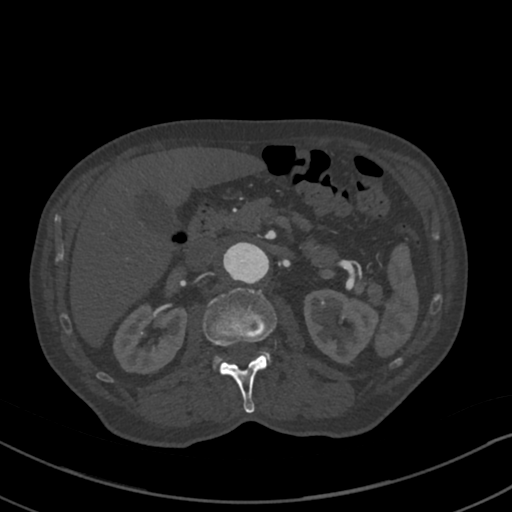
[im 187/217  soft-tissue]
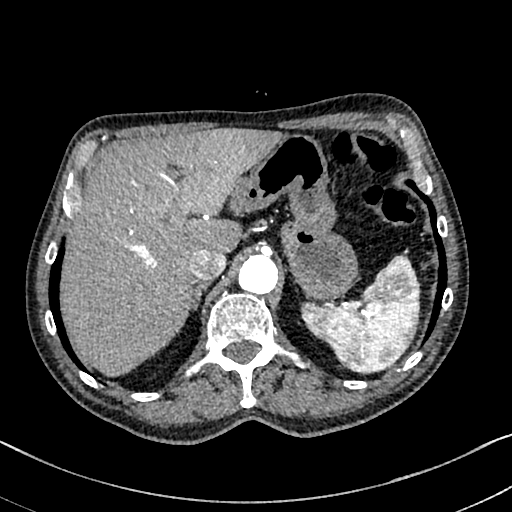
[im 207/217  soft-tissue]
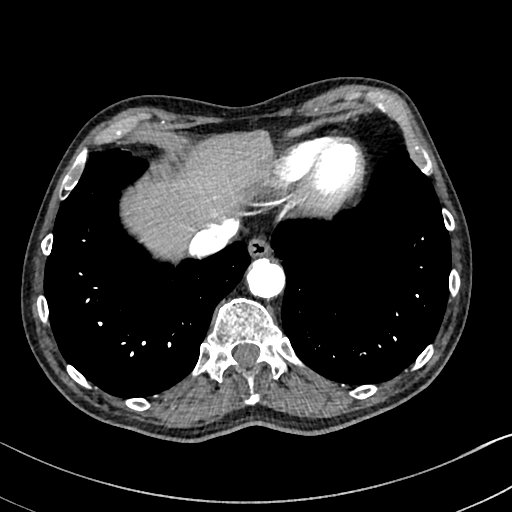

[Series 9: cta arterial 2.00 bv36 s3 cor art st · coronal · arterial · 0.67mm/px · 3 of 134 slices shown]
[im 34/134  soft-tissue]
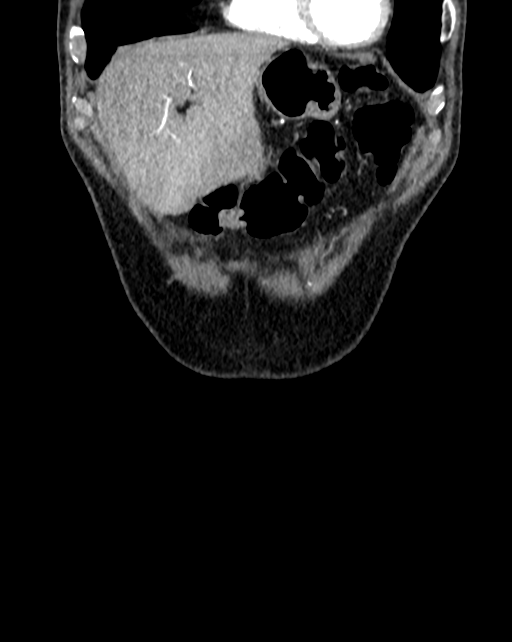
[im 67/134  soft-tissue]
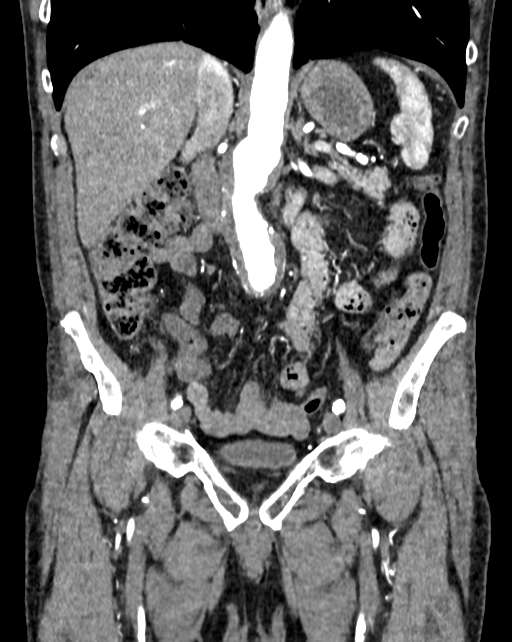
[im 100/134  soft-tissue]
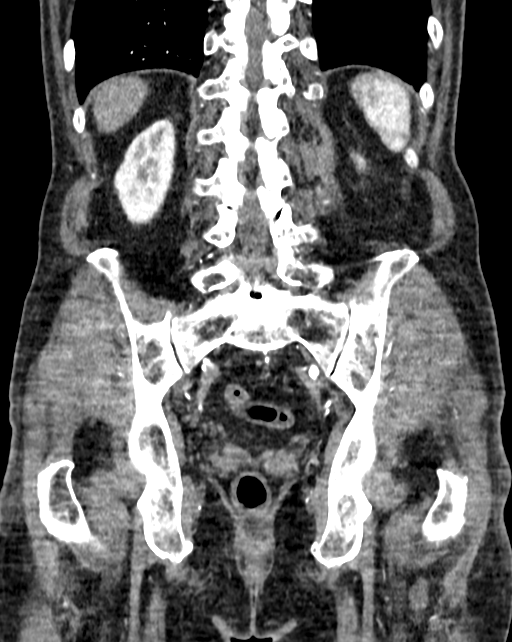

[14 of 46 positions shown; findings below may reference images not displayed]

RADIATION DOSE REDUCTION: This exam was performed according to the
departmental dose-optimization program which includes automated
exposure control, adjustment of the mA and/or kV according to
patient size and/or use of iterative reconstruction technique.

CONTRAST:  75mL FZQ1W2-SBX IOPAMIDOL (FZQ1W2-SBX) INJECTION 76%
FINDINGS: VASCULAR

Aorta: Fusiform, bilobed infrarenal abdominal aortic aneurysm which
measures up to 3.9 cm in maximum short axis dimension superiorly and
4.0 cm inferiorly. Diffuse circumferential, fibrofatty and calcific
atherosclerotic changes throughout the abdominal aorta. In the
distal thoracic IR aorta, just superior to the diaphragmatic hiatus
is an anteriorly projecting, broad-based penetrating atheromatous
ulcer which measures approximately 12 mm in width and 4 mm in depth.

Celiac: Mild ostial stenosis secondary to atherosclerotic plaque.

SMA: Patent without evidence of aneurysm, dissection, vasculitis or
significant stenosis.

Renals: Single bilateral renal arteries are patent without evidence
of aneurysm, dissection, vasculitis, fibromuscular dysplasia or
significant stenosis.

IMA: Patent without evidence of aneurysm, dissection, vasculitis or
significant stenosis.

Inflow: Patent without evidence of aneurysm, dissection, vasculitis
or significant stenosis. Diffuse circumferential fibrofatty and
calcific atherosclerotic changes.

Proximal Outflow: Bilateral common femoral and visualized portions
of the superficial and profunda femoral arteries are patent without
evidence of aneurysm, dissection, vasculitis or significant
stenosis.

Veins: No obvious venous abnormality within the limitations of this
arterial phase study.

Review of the MIP images confirms the above findings.

NON-VASCULAR

Lower chest: Moderate centrilobular emphysema. Subsegmental right
basilar atelectasis.

Hepatobiliary: No focal liver abnormality is seen. No gallstones,
gallbladder wall thickening, or biliary dilatation.

Pancreas: Unremarkable. No pancreatic ductal dilatation or
surrounding inflammatory changes.

Spleen: Normal in size without focal abnormality.

Adrenals/Urinary Tract: Adrenal glands are unremarkable. Kidneys are
normal, without renal calculi, focal lesion, or hydronephrosis.
Bladder is unremarkable.

Stomach/Bowel: Stomach is within normal limits. Appendix appears
normal. Scattered descending and sigmoid diverticula without
surrounding inflammatory changes. No evidence of bowel wall
thickening, distention, or inflammatory changes.

Lymphatic: No abdominopelvic lymphadenopathy.

Reproductive: Prostate is enlarged measuring up to 6.2 cm in maximum
axial dimensions with median lobe bulge into the base of the
bladder.

Other: No abdominal wall hernia or abnormality. No abdominopelvic
ascites.

Musculoskeletal: No acute or significant osseous findings.
IMPRESSION: VASCULAR

1. Bilobed fusiform infrarenal abdominal aortic aneurysm measuring
up to 4.1 cm. Recommend follow-up every 12 months and vascular
consultation. This recommendation follows ACR consensus guidelines:
White Paper of the ACR Incidental Findings Committee II on Vascular
Findings. [HOSPITAL] 6923; [DATE].
2. Broad-based, anterior projecting penetrating atheromatous ulcer
(1.2 x 0.4 cm) in the distal descending thoracic aorta, similar to
02/25/2021 comparison.
3.  Aortic Atherosclerosis (E7514-VYA.A).

NON-VASCULAR

1.  Emphysema (E7514-8Z7.7).
2. Diverticulosis.
3. Prostatomegaly.

## 2023-05-16 IMAGING — CT CT CHEST W/O CM
2 of 5 series · 15 of 36 positions shown, 18 images · non-contrast
Comparison: Chest CT 08/29/2021

CLINICAL DATA: Lung nodule.



[Series 4: chest 2.00 br40 s3 · coronal · 0.70mm/px · 3 of 134 slices shown]
[im 27/134  lung]
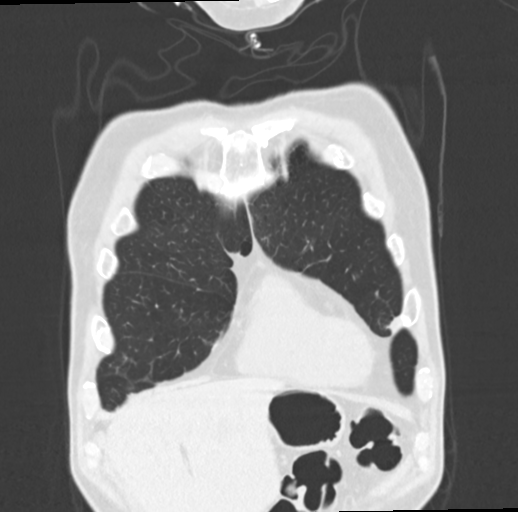
[im 54/134  lung]
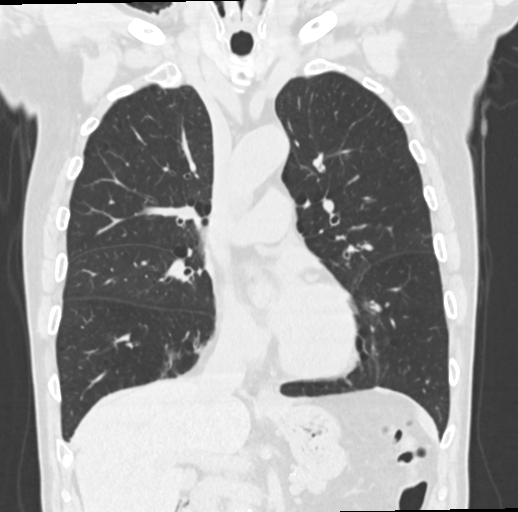
[im 80/134  lung]
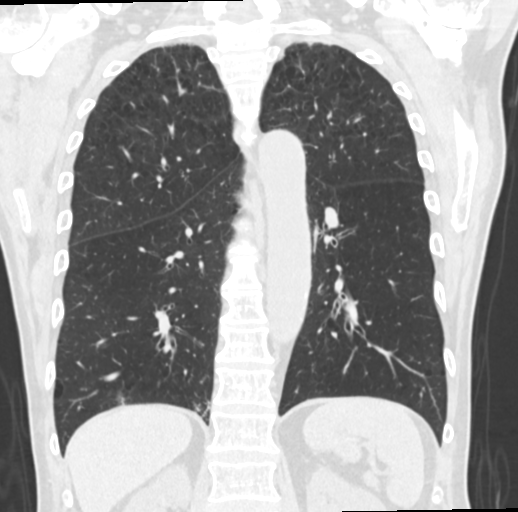

[Series 10: chest 1.00 br40 s3 super d · axial · 0.71mm/px · z∈[+1592,+1902]mm · 12 of 448 slices shown, 15 images]
[im 30/448  mediastinal]
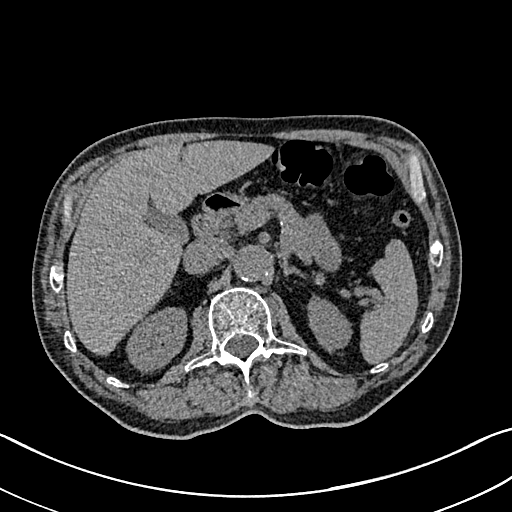
[im 30/448  lung]
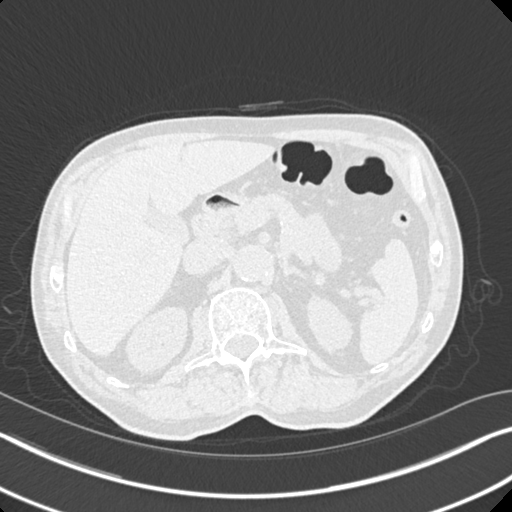
[im 60/448  lung]
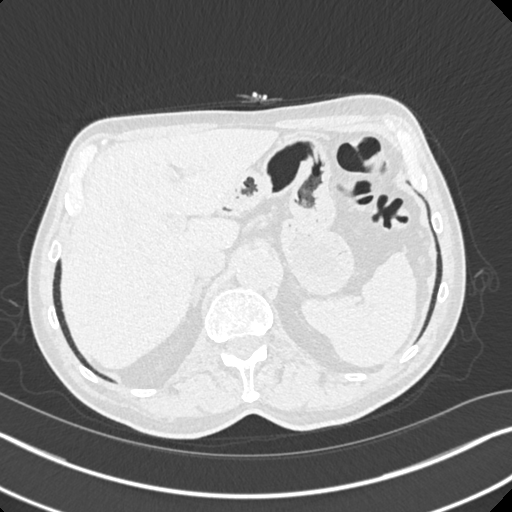
[im 90/448  lung]
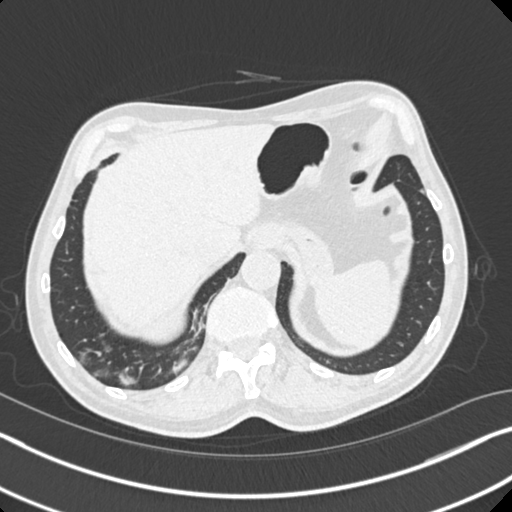
[im 150/448  lung]
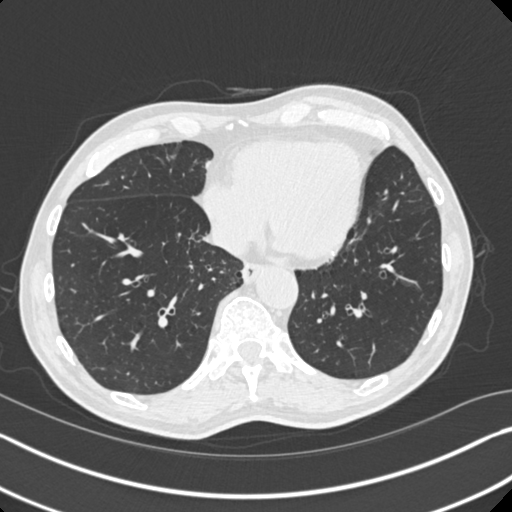
[im 179/448  mediastinal]
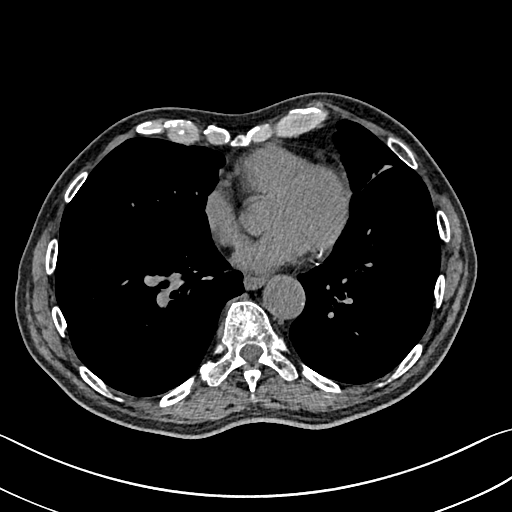
[im 179/448  lung]
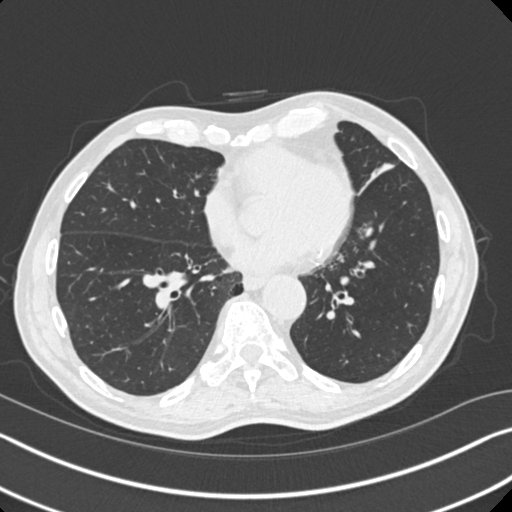
[im 209/448  lung]
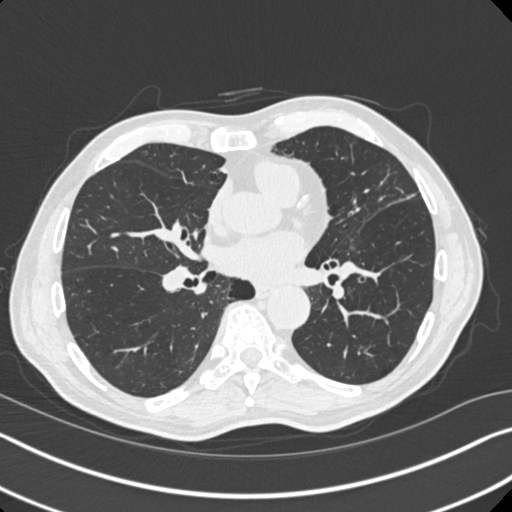
[im 239/448  lung]
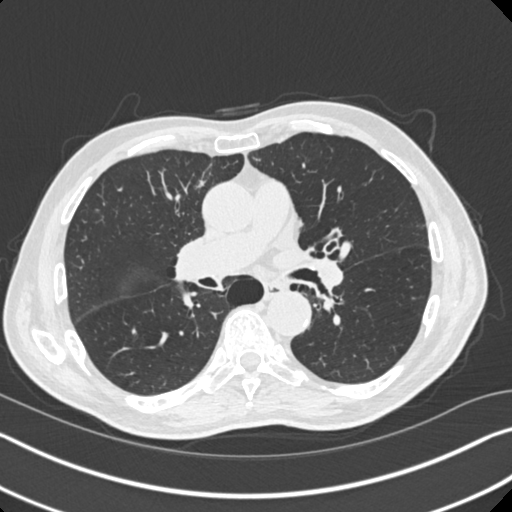
[im 269/448  lung]
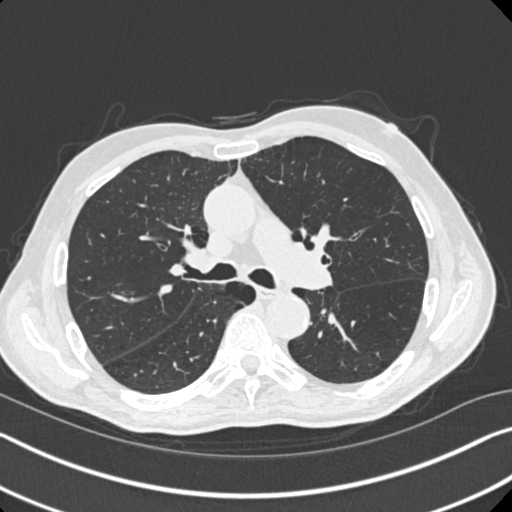
[im 299/448  mediastinal]
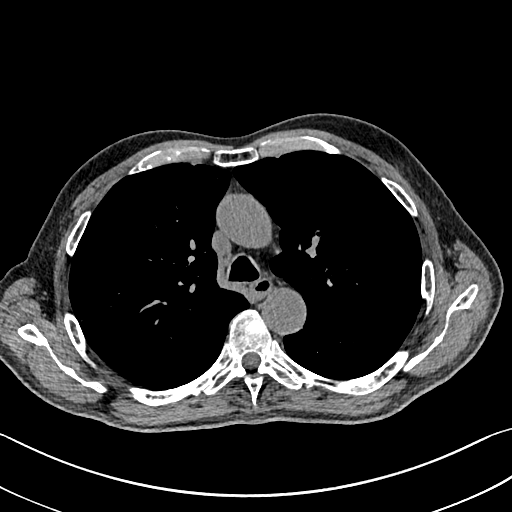
[im 299/448  lung]
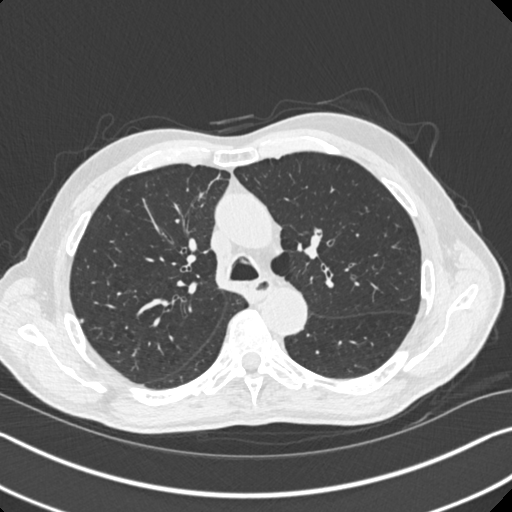
[im 358/448  lung]
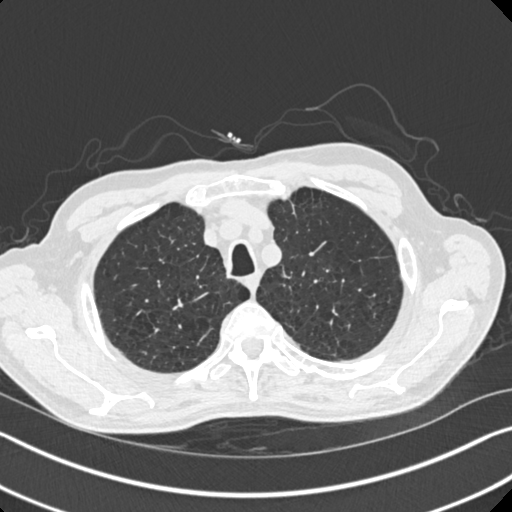
[im 388/448  lung]
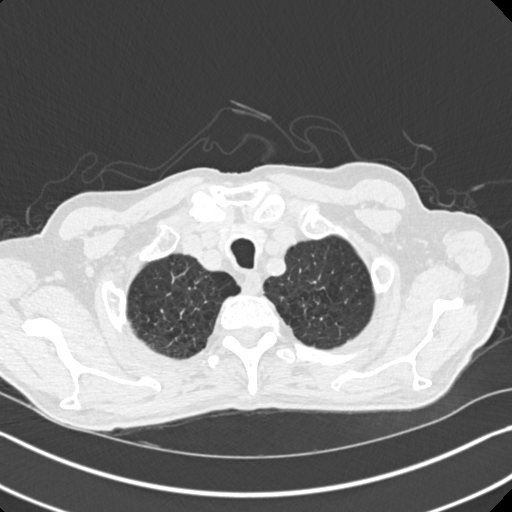
[im 418/448  lung]
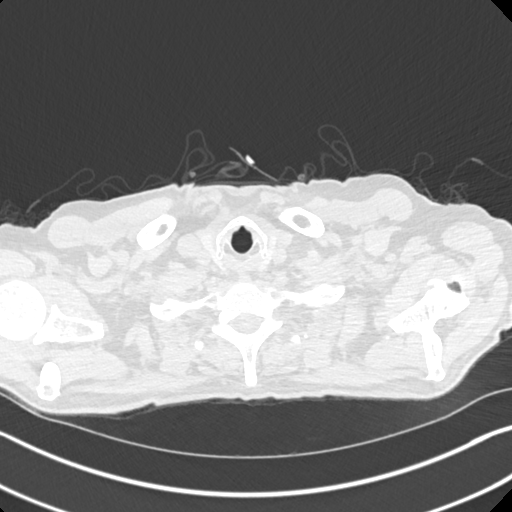

[15 of 36 positions shown; findings below may reference images not displayed]

FINDINGS: Cardiovascular: Coronary artery calcification and aortic
atherosclerotic calcification. Ascending thoracic aorta normal
diameter 33 mm.

Mediastinum/Nodes: No axillary or supraclavicular adenopathy. No
mediastinal or hilar adenopathy. No pericardial fluid. Esophagus
normal.

Lungs/Pleura: The LEFT upper lobe nodule of concern measures 5 mm
(image 52/series 8) compared to 6 mm on CT 08/29/2021. Visually
nodule appears decreased in size.

There is a second LEFT upper lobe nodule slightly inferior measuring
3 mm (image 58/8) which is increased from approximately 1 mm on most
recent comparison exam. On CT 11/27/2020 this more inferior nodule
measured 4 mm.

No additional pulmonary nodules of suspicion.

Centrilobular emphysema the upper lobes.

Upper Abdomen: Limited view of the liver, kidneys, pancreas are
unremarkable. Normal adrenal glands.

Musculoskeletal: No aggressive osseous lesion.
IMPRESSION: 1. Nodule of concern in the LEFT upper lobe is decreased size.
2. Second nearby LEFT upper lobe nodule measures larger than most
recent prior exam. Recommend continued CT surveillance.

## 2023-10-20 NOTE — Progress Notes (Addendum)
 RADIATION ONCOLOGY FOLLOW-UP NOTE  Patient Name:  Austin Lamb Date Of Birth:  11-28-1940 Medical Record Number:  [id number] Date:  10/20/23  Consulting Provider: Reyes Kirsten, MD Referring Provider: Cleotilde Austin BIRCH, MD Patient Care Team: Austin Cheryl Gander, PA as PCP - General (Family Medicine) Austin Kirsten, MD as Consulting Physician (Radiation Oncology) Austin Lamb Cleotilde, MD as Consulting Physician (Otolaryngology) Austin FORBES Rouleau, RN as Nurse Navigator (Oncology) Austin Lamb, RD LDN as Dietitian (Nutrition) Austin Lamb, RD LDN as Dietitian (Nutrition)  Reason for Visit: Head And Neck Cancer (Follow Up; Larynx)  ASSESSMENT AND PLAN   Problem List       Hematology and Neoplasia   Squamous cell carcinoma of larynx (CMS/HCC) [facility]) - Primary   Current Assessment & Plan    Assessment/plan: 83 year old gentleman status post definitive external beam irradiation for 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  He is scheduled to see Dr. Cleotilde in late March and will return to this clinic in early June, 2025 with a goal of seeing [name] on an alternating basis in the post treatment setting for a year after completing treatment. He was asked to contact me should there be any questions prior to his next visit.        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  After treatment, his voice remained hoarse for a couple of weeks and then improved to the point where hoarseness is currently rarely an issue.  He notes that he has to clear his throat on occasion and that sometimes the  mucus causes his voice to be a little hoarse.  He indicates no other symptoms at present.  He reports that he is eating better and continuing to slowly gain weight since completing treatment.  He indicates no trouble swallowing.  He indicates no throat pain.  He was evaluated by Dr. Cleotilde in October and received a good report with fiberoptic examination revealing no evidence of malignancy, however a small amount of laryngeal edema-note in epic. REVIEW OF SYSTEMS   Review of Systems  Constitutional:  Negative for activity change, appetite change and unexpected weight change.  HENT:  Negative for sore throat, trouble swallowing and voice change.   All other systems reviewed and are negative.  Vitals:   10/20/23 1414  BP: (!) 182/81  BP Location: Right arm  Patient Position: Sitting  Pulse: 61  Resp: 20  SpO2: 93%  Weight: 137 lb 6.4 oz (62.3 kg)  Height: 5' 10 (1.778 m)  PainSc: 0-No pain   Body mass index is 19.71 kg/m.  PHYSICAL EXAMINATION   Physical Exam General:  Pleasant well-appearing patient in no acute distress appearing younger than stated age HEENT:  [place]/AT, sclerae anicteric.  Oral cavity /Oropharynx: no mucosal/ submucosal lesions FOL:  After obtaining informed consent, after placement of topical anesthesia into the right naris and after performing a time-out procedure, nasopharyngoscopy is performed through the right nares using a disposable scope.  The right nasal cavity, nasopharynx and posterior pharyngeal wall regions all appear without abnormality.  The right vocal cord is now clear-the previously present exophytic growth encompassing  the mid portion of the right true vocal cord is no longer present.  Both cords are mobile and approximate well without lesion.  There is no significant swelling.  A small amount of postnasal drip mucus is evident in isolated locations in the pharyngeal and supraglottic laryngeal location.  The piriform sinus region is clear. Neck:  Supple  without thyromegaly. Lymph nodes:  No palpable cervical, supraclavicular adenopathy Lungs:  Clear to auscultation throughout-no rales or rhonchi are noted.  Neurologic:  Alert and oriented x3 with cranial nerves 2-12 intact, no gait disturbances observed Psychiatric:  No mood disturbances . LABS/RADIOLOGY  Non

## 2024-01-28 ENCOUNTER — Other Ambulatory Visit: Payer: Self-pay | Admitting: *Deleted

## 2024-01-28 DIAGNOSIS — I7143 Infrarenal abdominal aortic aneurysm, without rupture: Secondary | ICD-10-CM

## 2024-02-08 NOTE — Progress Notes (Unsigned)
 [facility] [place]  ASSESSMENT / PLAN: Austin Lamb is a 83 y.o. 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  I counseled him about the benign nature of this diagnosis and that his risk for rupture is very low (less than 3% over the next year).  We reviewed the rationale for surveillance.  We will plan to see him again in 12 months with a duplex of his abdominal aorta.  To reduce his risk of growth recommend:  Complete cessation from all tobacco products. Blood glucose control with goal A1c < 7%. Blood pressure control with goal blood pressure < 140/90 mmHg. Lipid reduction therapy with goal LDL-C <100 mg/dL. Aspirin 81mg  PO QD.  Atorvastatin 40-80mg  PO QD (or other "high intensity" statin therapy).  CHIEF COMPLAINT: Aneurysm  HISTORY OF PRESENT ILLNESS: Austin Lamb is a 83 y.o. male referred to clinic for discussion of infrarenal abdominal aortic aneurysm demonstrated on recent CT scan 12/03/2021.  Patient reports chronic left upper quadrant abdominal pain about his costal margin.  He has no other symptoms of abdominal discomfort.  He has no symptoms referable to his aneurysm.  We spent the bulk of our visit reviewing the natural history of aneurysm disease and the rationale for surveillance.  12/16/22: Patient returns to clinic for surveillance.  He is overall doing quite well.  He is accompanied by 2 daughters.  He has no complaints for me today.  02/09/24: Patient returns to clinic for surveillance of his aneurysm.  It [birth year] today!  We reviewed his duplex in detail.  He has no symptoms attributable to his aortic aneurysm.  Past Medical History:  Diagnosis Date   Anxiety    BPH (benign prostatic hyperplasia)    Claustrophobia    Depression    Dysrhythmia    non-sustained SVT 02/25/21   ED (erectile dysfunction)    GERD (gastroesophageal reflux disease)    Hearing loss    Hyperlipidemia    Hypertension    PVD  (peripheral vascular disease) (HCC)     Past Surgical History:  Procedure Laterality Date   cardiovasular stress test  11/24/2007   COLONOSCOPY  2007    Family History  Problem Relation Age of Onset   Cancer Mother    Stroke Father     Social History   Socioeconomic History   Marital status: Single    Spouse name: Not on file   Number of children: Not on file   Years of education: Not on file   Highest education level: Not on file  Occupational History   Not on file  Tobacco Use   Smoking status: Former    Current packs/day: 0.00    Average packs/day: 1 pack/day for 25.0 years (25.0 ttl pk-yrs)    Types: Cigarettes    Start date: 11/1992    Quit date: 11/2017    Years since quitting: 6.2   Smokeless tobacco: Never  Vaping Use   Vaping status: Never Used  Substance and Sexual Activity   Alcohol use: Not Currently   Drug use: Never   Sexual activity: Not Currently  Other Topics Concern   Not on file  Social History Narrative   Not on file   Social Drivers of Health   Financial Resource Strain: Not on file  Food Insecurity: Not on file  Transportation Needs: Not on file  Physical Activity: Not on file  Stress: Not on file  Social Connections: Not on file  Intimate Partner Violence: Not on file    Allergies  Allergen Reactions   Codeine Itching and Rash   Penicillins Itching and Rash    Current Outpatient Medications  Medication Sig Dispense Refill   acetaminophen (TYLENOL) 500 MG tablet Take 500-1,000 mg by mouth every 6 (six) hours as needed for moderate pain.     cilostazol (PLETAL) 100 MG tablet Take 100 mg by mouth 2 (two) times daily.     diazepam (VALIUM) 10 MG tablet Take 10 mg by mouth 2 (two) times daily as needed for anxiety.     nebivolol  (BYSTOLIC ) 5 MG tablet Take 5 mg by mouth daily.     pravastatin (PRAVACHOL) 40 MG tablet Take 40 mg by mouth daily.     No current facility-administered medications for this visit.    PHYSICAL  EXAM There were no vitals filed for this visit.  Well-appearing elderly man in no acute distress Regular rate and rhythm Unlabored breathing Soft, nontender abdomen.  Palpable pulsatile mass in the epigastric abdomen No reproducible tenderness in the left upper quadrant Palpable pedal pulses bilaterally  PERTINENT LABORATORY AND RADIOLOGIC DATA  Most recent CBC    Latest Ref Rng & Units 08/29/2021   11:40 AM  CBC  WBC 4.0 - 10.5 K/uL 7.5   Hemoglobin 13.0 - 17.0 g/dL 96.0   Hematocrit 45.4 - 52.0 % 45.3   Platelets 150 - 400 K/uL 259      Most recent CMP    Latest Ref Rng & Units 08/29/2021   11:40 AM  CMP  Glucose 70 - 99 mg/dL 098   BUN 8 - 23 mg/dL 7   Creatinine 1.19 - 1.47 mg/dL 8.29   Sodium 562 - 130 mmol/L 137   Potassium 3.5 - 5.1 mmol/L 4.3   Chloride 98 - 111 mmol/L 99   CO2 22 - 32 mmol/L 28   Calcium 8.9 - 10.3 mg/dL 9.4   Total Protein 6.5 - 8.1 g/dL 7.5   Total Bilirubin 0.3 - 1.2 mg/dL 0.5   Alkaline Phos 38 - 126 U/L 93   AST 15 - 41 U/L 19   ALT 0 - 44 U/L 13    AAA duplex Abdominal Aorta: There is evidence of abnormal dilatation of the mid and  distal Abdominal aorta. The largest aortic diameter is 4.21 cm; slightly  larger than previous diameter measurement of 4.06 cm on 12/16/2022. The  bilateral common iliac arteries  appear essentially unchanged.   Heber Little. Edgardo Goodwill, MD [facility] Phone Number: [phone] 02/08/2024 11:09 AM  Total time spent on preparing this encounter including chart review, data review, collecting history, examining the patient, coordinating care for this established patient, 20 minutes.  Portions of this report may have been transcribed using voice recognition software.  Every effort has been made to ensure accuracy; however, inadvertent computerized transcription errors may still be present.

## 2024-02-09 ENCOUNTER — Encounter: Payer: Self-pay | Admitting: Vascular Surgery

## 2024-02-09 ENCOUNTER — Ambulatory Visit: Payer: Medicare PPO | Attending: Vascular Surgery | Admitting: Vascular Surgery

## 2024-02-09 ENCOUNTER — Ambulatory Visit (HOSPITAL_COMMUNITY)
Admission: RE | Admit: 2024-02-09 | Discharge: 2024-02-09 | Disposition: A | Discharge status: Home / Self Care | Payer: Medicare PPO | Source: Ambulatory Visit | Attending: Vascular Surgery | Admitting: Vascular Surgery

## 2024-02-09 VITALS — BP 163/77 | HR 56 | Temp 98.2°F | Ht 68.0 in | Wt 140.0 lb

## 2024-02-09 DIAGNOSIS — I7143 Infrarenal abdominal aortic aneurysm, without rupture: Secondary | ICD-10-CM | POA: Diagnosis present

## 2024-07-07 NOTE — Progress Notes (Signed)
 [facility] [place] Patient Name:  Austin Lamb (DOB: 1941-04-17) MRN:  [id number] Appt Date:  07/07/2024    CHIEF COMPLAINT   Illness (Patient presents to the clinic for productive  cough, shortness of breath for the last month. Patient denies taking any medication for this issue and has not smoked for 8 years. )   ASSESSMENT AND PLAN   Assessment/Plan Diagnoses and all orders for this visit: 1. Subacute cough -     X-Ray Chest PA and Lateral -     Ambulatory referral to Pulmonology -     CT Chest Without Contrast -     albuterol HFA 90 mcg/actuation inhl inhaler; Inhale 2 puffs every 4 (four) hours as needed for wheezing or shortness of breath.  Dispense: 8 g; Refill: 5 -     doxycycline (Vibramycin) 100 mg capsule; Take 1 capsule (100 mg total) by mouth in the morning and 1 capsule (100 mg total) before bedtime. Do all this for 10 days.  Dispense: 20 capsule; Refill: 0 2. Mass of upper lobe of left lung -     X-Ray Chest PA and Lateral -     Ambulatory referral to Pulmonology -     CT Chest Without Contrast -     albuterol HFA 90 mcg/actuation inhl inhaler; Inhale 2 puffs every 4 (four) hours as needed for wheezing or shortness of breath.  Dispense: 8 g; Refill: 5  MDM Number of Diagnoses or Management Options Diagnosis management comments: Austin Lamb is a very pleasant 83 y.o.  male presents for c/o of cough 1 month ago   Desc; cough: nonproductive and paroxysmal Childhood vaccines are UTD, Eating, drinking and urinating adequately as per history, Afebrile, non-toxic/well appearing, active w/ good tone. Abdomen soft nontender abdomen soft nontender. Well hydrated , mm moist, good peripheral perfusion   Afebrile, non-toxic/well appearing, active w/ good tone. Abdomen soft nontender abdomen soft nontender. Well hydrated , mm moist, good peripheral perfusion.   Based on the patient's clinical presentation, I am suspect a viral vs bacterial  etiology for the cough and will treat conservatively. patient presents with cough without wheezing, no tachypnea and no accessory muscle usethat would be indicative of respiratory distress ML from asthma exacerbation. The oral mucosa is normal. The salivary glands are without swelling. The hard and soft palates are intact. The tongue is without masses or swelling with normal movement. The tonsils are without inflammation. The posterior pharynx is without mass lesion with good patent oropharyngeal airway.Pt has never been intubated or admitted to the hospital for asthma exacerbation and has never had oral steroids. History and exam consistent withViral Syndrome. No CHF, COPD, bronchiolitis, FBAO, GERD.No AMS, silent respirations, belly-breathing, or other sign of impending ventilatory failure. 95 % room air Disposition: Plan to DC home with strict return precautions to ED and follow up plan with primary care physician for reassesment  Plan; cough: All questions answered. Analgesics as needed, doses reviewed. Extra fluids as tolerated. Follow up as needed should symptoms fail to improve. Normal progression of disease discussed. Vaporizer as needed. Has hx of throat cancer and previous mass on cxr, will treat and consult to pulmonology The patient is nontoxic and in no distress and is playful active and appropriate for age.  The patient is to go to the ER for any new, changing, worsening symptoms or for any concerns.  I discussed this visit with the parent in detail and answered any questions.  They agree with the  plan and are comfortable going home.   A  AP Chest X-Ray was ordered. My reading of this film is CXR FINDINGS:large mass LUL hx previous same finding but smaller has had multiple CT chest for follow up: no tracheal deviation, non-widened mediastenum, normal cardiac boarder, no pleural effusion, no air under diaphragm,, no ptx, no gross bony abnormalities . ( comparison films available: pending  review by Radiologist.)         Amount and/or Complexity of Data Reviewed Tests in the radiology section of CPT: ordered Independent visualization of images, tracings, or specimens: yes  Risk of Complications, Morbidity, and/or Mortality Presenting problems: low Management options: low    HISTORY OF PRESENT ILLNESS   Subjective  Incidental findings were discussed with the patient and it was strongly recommended that the patient follow-up with PCP for further workup as needed.   Pulmonologist and CT follow up was ordered     Allergies  Allergen Reactions  . Codeine GI intolerance, Itching, Rash and Other (see comments)    REVIEW OF SYSTEMS   Review of Systems  Constitutional:  Negative for chills and fever.  HENT:  Positive for congestion.   Respiratory:  Positive for cough. Negative for chest tightness and shortness of breath.   Cardiovascular:  Negative for chest pain and palpitations.  Gastrointestinal:  Negative for nausea and vomiting.    PHYSICAL EXAMINATION   Objective Vitals:   07/07/24 0826  BP: (!) 159/79  BP Location: Right arm  Patient Position: Sitting  Pulse: 62  Resp: (!) 23  Temp: 36.6 C (97.9 F)  TempSrc: Oral  SpO2: 95%  Weight: 136 lb 9.6 oz (62 kg)  Height: 5' 10 (1.778 m)   Physical Exam Vitals and nursing note reviewed.  Constitutional:      General: He is not in acute distress.    Appearance: Normal appearance. He is well-developed and normal weight. He is not ill-appearing or toxic-appearing.  HENT:     Head: Normocephalic and atraumatic.  Cardiovascular:     Rate and Rhythm: Normal rate and regular rhythm.     Heart sounds: Normal heart sounds.  Pulmonary:     Effort: Pulmonary effort is normal.     Breath sounds: Normal breath sounds.  Musculoskeletal:     Cervical back: Normal range of motion and neck supple.  Skin:    General: Skin is warm.     Capillary Refill: Capillary refill takes less than 2 seconds.      Findings: No ecchymosis, erythema or rash.  Neurological:     General: No focal deficit present.     Mental Status: He is alert and oriented to person, place, and time. Mental status is at baseline.      X-Ray Chest PA and Lateral Result Date: 07/07/2024 1.  No acute cardiopulmonary abnormality. 2.  4.3 cm rounded, partially obscured mass projecting over the left upper lung field concerning for malignancy. Pulmonology consultation advised. Lonni SHAUNNA Bellman 07/07/2024 8:55 AM    Lonni KATHEE Mad, PA

## 2024-07-08 NOTE — Telephone Encounter (Signed)
 Daughter states father has hx of nodule in chest, noted 12/09/22 Dr. Kerrin. Would like to know if antibiotic would possible as nodule resolved on it's own noted 12/09/22 CT. States she would like a call, and is disappointed she did not receive one last night.

## 2024-07-15 ENCOUNTER — Other Ambulatory Visit: Payer: Self-pay | Admitting: Thoracic Surgery (Cardiothoracic Vascular Surgery)

## 2024-07-15 DIAGNOSIS — R918 Other nonspecific abnormal finding of lung field: Secondary | ICD-10-CM

## 2024-07-20 ENCOUNTER — Encounter (HOSPITAL_COMMUNITY)
Admission: RE | Admit: 2024-07-20 | Discharge: 2024-07-20 | Disposition: A | Discharge status: Home / Self Care | Source: Ambulatory Visit | Attending: Thoracic Surgery (Cardiothoracic Vascular Surgery) | Admitting: Thoracic Surgery (Cardiothoracic Vascular Surgery)

## 2024-07-20 DIAGNOSIS — R918 Other nonspecific abnormal finding of lung field: Secondary | ICD-10-CM | POA: Diagnosis present

## 2024-07-20 LAB — GLUCOSE, CAPILLARY: Glucose-Capillary: 98 mg/dL (ref 70–99)

## 2024-07-20 MED ORDER — FLUDEOXYGLUCOSE F - 18 (FDG) INJECTION
7.0000 | Freq: Once | INTRAVENOUS | Status: AC
  Administered 2024-07-20: 6.97 via INTRAVENOUS

## 2024-07-21 ENCOUNTER — Ambulatory Visit: Admitting: Thoracic Surgery (Cardiothoracic Vascular Surgery)

## 2024-07-25 ENCOUNTER — Ambulatory Visit
Attending: Thoracic Surgery (Cardiothoracic Vascular Surgery) | Admitting: Thoracic Surgery (Cardiothoracic Vascular Surgery)

## 2024-07-25 ENCOUNTER — Other Ambulatory Visit: Payer: Self-pay | Admitting: *Deleted

## 2024-07-25 ENCOUNTER — Encounter: Payer: Self-pay | Admitting: *Deleted

## 2024-07-25 ENCOUNTER — Encounter: Payer: Self-pay | Admitting: Thoracic Surgery (Cardiothoracic Vascular Surgery)

## 2024-07-25 VITALS — BP 166/83 | HR 67 | Resp 18 | Ht 68.0 in | Wt 136.0 lb

## 2024-07-25 DIAGNOSIS — R918 Other nonspecific abnormal finding of lung field: Secondary | ICD-10-CM | POA: Diagnosis not present

## 2024-07-25 NOTE — Progress Notes (Signed)
 PCP is Pcp, No Referring Provider is Alvia Toribio Redbird, *  Chief Complaint  Patient presents with   Lung Lesion    PET 10/10    HPI: Austin Lamb is sent for consultation again regarding a left upper lobe mass.  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  I first saw Austin Lamb in the spring 2022.  He had a CT which showed a left upper lobe lung nodule.  A follow-up scan in August showed the nodule increased in size to 9 mm.  Mildly hypermetabolic on PET with an SUV of 2.  He was scheduled for a navigational bronchoscopy but on his preop CT the nodule had gotten smaller.  I then followed him for 2 years.  The more suspicious nodule resolved.  There was a small area of architectural distortion that did not change.  Recently he started feeling more short of breath.  Pulse oximetry showed sometimes his saturations dropping into the 80s with exertion.  He went to the doctor and had a chest x-ray which showed a left upper lobe mass.  That led to a CT of the chest.  It showed a 4.4 x 7.2 cm left upper lobe mass.  No obvious hilar or mediastinal adenopathy.  PET/CT showed the mass was hypermetabolic with an SUV of 20.  There was hypermetabolic activity in the left hilar region.  No evidence of mediastinal adenopathy or distant metastases.  No change in appetite or weight loss.  Denies unusual headaches.  Shortness of breath as noted above.  Previously smoked a pack of cigarettes a day for many years prior to quitting in 2019.  Zubrod Score: At the time of surgery this patient's most appropriate activity status/level should be described as: []     0    Normal activity, no symptoms [x]     1    Restricted in physical strenuous activity but ambulatory, able to do out light work []     2    Ambulatory  and capable of self care, unable to do work activities, up and about >50 % of waking hours                              []     3    Only limited self care, in bed greater than 50% of waking hours []     4    Completely disabled, no self care, confined to bed or chair []     5    Moribund  Past Medical History:  Diagnosis Date   Anxiety    BPH (benign prostatic hyperplasia)    Claustrophobia    Depression    Dysrhythmia    non-sustained SVT 02/25/21   ED (erectile dysfunction)    GERD (gastroesophageal reflux disease)    Hearing loss    Hyperlipidemia    Hypertension    PVD (peripheral vascular disease)     Past Surgical History:  Procedure Laterality Date   cardiovasular stress test  11/24/2007   COLONOSCOPY  2007    Family History  Problem Relation Age of Onset   Cancer Mother    Stroke Father     Social History Social History   Tobacco Use   Smoking status: Former    Current packs/day: 0.00    Average  packs/day: 1 pack/day for 25.0 years (25.0 ttl pk-yrs)    Types: Cigarettes    Start date: 11/1992    Quit date: 11/2017    Years since quitting: 6.7   Smokeless tobacco: Never  Vaping Use   Vaping status: Never Used  Substance Use Topics   Alcohol use: Not Currently   Drug use: Never    Current Outpatient Medications  Medication Sig Dispense Refill   acetaminophen (TYLENOL) 500 MG tablet Take 500-1,000 mg by mouth every 6 (six) hours as needed for moderate pain.     albuterol (VENTOLIN HFA) 108 (90 Base) MCG/ACT inhaler Inhale 2 puffs into the lungs.     cilostazol (PLETAL) 100 MG tablet Take 100 mg by mouth 2 (two) times daily.     diazepam (VALIUM) 10 MG tablet Take 10 mg by mouth 2 (two) times daily as needed for anxiety.     nebivolol  (BYSTOLIC ) 5 MG tablet Take 5 mg by mouth daily.     pravastatin (PRAVACHOL) 40 MG tablet Take 40 mg by mouth daily.     No current facility-administered medications for this visit.    Allergies  Allergen Reactions    Codeine Itching and Rash   Penicillins Itching and Rash    Review of Systems  Constitutional:  Positive for activity change. Negative for appetite change and unexpected weight change.  HENT:  Negative for trouble swallowing.   Respiratory:  Positive for cough (No hemoptysis) and shortness of breath. Negative for wheezing and stridor.     BP (!) 166/83 (BP Location: Right Arm)   Pulse 67   Resp 18   Ht 5' 8 (1.727 m)   Wt 136 lb (61.7 kg)   SpO2 91%   BMI 20.68 kg/m  Physical Exam Vitals reviewed.  Constitutional:      General: He is not in acute distress.    Appearance: Normal appearance.  HENT:     Head: Normocephalic and atraumatic.  Eyes:     Extraocular Movements: Extraocular movements intact.  Cardiovascular:     Rate and Rhythm: Normal rate and regular rhythm.     Heart sounds: Normal heart sounds. No murmur heard.    No friction rub. No gallop.  Pulmonary:     Effort: Pulmonary effort is normal.     Breath sounds: Wheezing (Faint at left base) present.  Abdominal:     Palpations: Abdomen is soft.  Musculoskeletal:     Cervical back: Neck supple.  Lymphadenopathy:     Cervical: No cervical adenopathy.  Skin:    General: Skin is warm and dry.  Neurological:     General: No focal deficit present.     Mental Status: He is alert and oriented to person, place, and time.     Cranial Nerves: No cranial nerve deficit.     Motor: No weakness.    Diagnostic Tests: NUCLEAR MEDICINE PET SKULL BASE TO THIGH   TECHNIQUE: 7.0 mCi F-18 FDG was injected intravenously. Full-ring PET imaging was performed from the skull base to thigh after the radiotracer. CT data was obtained and used for attenuation correction and anatomic localization.   Fasting blood glucose: 98 mg/dl   COMPARISON:  CT chest 07/12/2024, 12/09/2022, 03/27/2022.   FINDINGS: Mediastinal blood pool activity: SUV max 2.2   Liver activity: SUV max NA   NECK:   No abnormal hypermetabolism.    Incidental CT findings:   None.   CHEST:   Posterior left upper lobe mass measures 4.4 x  7.2 cm, SUV max 20.3. Adjacent 9 mm satellite nodule in the medial left upper lobe (4/65), SUV max 6.5. Hypermetabolic left hilar lymph node, SUV max 9.6. No additional abnormal hypermetabolism.   Incidental CT findings:   Atherosclerotic calcification of the aorta, aortic valve and coronary arteries. Heart is at the upper limits of normal in size. No pericardial or pleural effusion. Centrilobular and paraseptal emphysema. Mild surrounding interstitial thickening in the left upper lobe.   ABDOMEN/PELVIS:   No abnormal hypermetabolism.   Incidental CT findings:   Infrarenal aortic aneurysm measures approximately 5.3 cm. Prostate is enlarged.   SKELETON:   No abnormal hypermetabolism.   Incidental CT findings:   Osteopenia.  Degenerative changes in the spine.   IMPRESSION: 1. 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 2. 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 Recommend follow-up every 6 months and vascular consultation. This recommendation follows ACR consensus guidelines: White Paper of the ACR Incidental Findings Committee II on Vascular Findings. J Am Coll Radiol 2013; 10:789-794. 3. Aortic atherosclerosis (ICD10-I70.0). Coronary artery calcification. 4.  Emphysema (ICD10-J43.9).     Electronically Signed   By: Newell Eke M.D.   On: 07/22/2024 12:49 I personally reviewed the CT and PET/CT images.  Large left upper lobe mass with left hilar adenopathy.  Emphysema.  Aortic and coronary calcification.  Infrarenal abdominal aneurysm.  Impression: Austin Lamb is an 83 year old man with a history of tobacco use, lung nodules, hypertension, hyperlipidemia, PAD, AAA, nonsustained VT, reflux, anxiety, depression, and stage I squamous cell  carcinoma of the larynx treated with radiation.  Recently presented with shortness of breath and found to have a large left upper lobe mass.  Left upper lobe mass-4.4 x 7.2 cm.  Hypermetabolic on PET with hilar adenopathy.  Highly suspicious for a primary bronchogenic carcinoma.  Clinical stage IIIa (T4, N1).  Metastatic cancer from his previously treated laryngeal cancer and infection are also in the differential diagnosis but are extremely unlikely.  I recommended that we proceed with bronchoscopy and endobronchial ultrasound for definitive diagnosis and staging.  I am not sure we will be able to reach the notes with endobronchial ultrasound but it is worth an attempt.  I described the postoperative procedure to Austin Lamb and his daughters.  I informed them of the general nature of the procedure.  They understand will be done in the operating room under general anesthesia.  Will plan to do it on an outpatient basis.  I informed him of the indications, risks, benefits, and alternatives.  They understand the risks include, but are not limited to death, MI, DVT, and procedure specific risks such as bleeding or airway complications.  He accepts the risk and agrees to proceed.  Abdominal aortic aneurysm-has been followed by Dr. Magda.  Last visit was April 2025.  Radiology measured AAA at 5.3 cm.  Will let Dr. Magda know to see if any additional workup is necessary.  Tobacco use-quit smoking 6 years ago.  Laryngeal cancer-recent follow-up visit showed no evidence of recurrence.  Plan: Bronchoscopy with endobronchial ultrasound on Friday, 07/29/2024 Will alert Dr. Magda to findings in regard to abdominal aortic aneurysm  Austin JAYSON Millers, MD [facility] [phone]

## 2024-07-25 NOTE — H&P (View-Only) (Signed)
 PCP is Pcp, No Referring Provider is Alvia Toribio Redbird, *  Chief Complaint  Patient presents with   Lung Lesion    PET 10/10    HPI: Austin Lamb is sent for consultation again regarding a left upper lobe mass.  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  I first saw Austin Lamb in the spring 2022.  He had a CT which showed a left upper lobe lung nodule.  A follow-up scan in August showed the nodule increased in size to 9 mm.  Mildly hypermetabolic on PET with an SUV of 2.  He was scheduled for a navigational bronchoscopy but on his preop CT the nodule had gotten smaller.  I then followed him for 2 years.  The more suspicious nodule resolved.  There was a small area of architectural distortion that did not change.  Recently he started feeling more short of breath.  Pulse oximetry showed sometimes his saturations dropping into the 80s with exertion.  He went to the doctor and had a chest x-ray which showed a left upper lobe mass.  That led to a CT of the chest.  It showed a 4.4 x 7.2 cm left upper lobe mass.  No obvious hilar or mediastinal adenopathy.  PET/CT showed the mass was hypermetabolic with an SUV of 20.  There was hypermetabolic activity in the left hilar region.  No evidence of mediastinal adenopathy or distant metastases.  No change in appetite or weight loss.  Denies unusual headaches.  Shortness of breath as noted above.  Previously smoked a pack of cigarettes a day for many years prior to quitting in 2019.  Zubrod Score: At the time of surgery this patient's most appropriate activity status/level should be described as: []     0    Normal activity, no symptoms [x]     1    Restricted in physical strenuous activity but ambulatory, able to do out light work []     2    Ambulatory  and capable of self care, unable to do work activities, up and about >50 % of waking hours                              []     3    Only limited self care, in bed greater than 50% of waking hours []     4    Completely disabled, no self care, confined to bed or chair []     5    Moribund  Past Medical History:  Diagnosis Date   Anxiety    BPH (benign prostatic hyperplasia)    Claustrophobia    Depression    Dysrhythmia    non-sustained SVT 02/25/21   ED (erectile dysfunction)    GERD (gastroesophageal reflux disease)    Hearing loss    Hyperlipidemia    Hypertension    PVD (peripheral vascular disease)     Past Surgical History:  Procedure Laterality Date   cardiovasular stress test  11/24/2007   COLONOSCOPY  2007    Family History  Problem Relation Age of Onset   Cancer Mother    Stroke Father     Social History Social History   Tobacco Use   Smoking status: Former    Current packs/day: 0.00    Average  packs/day: 1 pack/day for 25.0 years (25.0 ttl pk-yrs)    Types: Cigarettes    Start date: 11/1992    Quit date: 11/2017    Years since quitting: 6.7   Smokeless tobacco: Never  Vaping Use   Vaping status: Never Used  Substance Use Topics   Alcohol use: Not Currently   Drug use: Never    Current Outpatient Medications  Medication Sig Dispense Refill   acetaminophen (TYLENOL) 500 MG tablet Take 500-1,000 mg by mouth every 6 (six) hours as needed for moderate pain.     albuterol (VENTOLIN HFA) 108 (90 Base) MCG/ACT inhaler Inhale 2 puffs into the lungs.     cilostazol (PLETAL) 100 MG tablet Take 100 mg by mouth 2 (two) times daily.     diazepam (VALIUM) 10 MG tablet Take 10 mg by mouth 2 (two) times daily as needed for anxiety.     nebivolol  (BYSTOLIC ) 5 MG tablet Take 5 mg by mouth daily.     pravastatin (PRAVACHOL) 40 MG tablet Take 40 mg by mouth daily.     No current facility-administered medications for this visit.    Allergies  Allergen Reactions    Codeine Itching and Rash   Penicillins Itching and Rash    Review of Systems  Constitutional:  Positive for activity change. Negative for appetite change and unexpected weight change.  HENT:  Negative for trouble swallowing.   Respiratory:  Positive for cough (No hemoptysis) and shortness of breath. Negative for wheezing and stridor.     BP (!) 166/83 (BP Location: Right Arm)   Pulse 67   Resp 18   Ht 5' 8 (1.727 m)   Wt 136 lb (61.7 kg)   SpO2 91%   BMI 20.68 kg/m  Physical Exam Vitals reviewed.  Constitutional:      General: He is not in acute distress.    Appearance: Normal appearance.  HENT:     Head: Normocephalic and atraumatic.  Eyes:     Extraocular Movements: Extraocular movements intact.  Cardiovascular:     Rate and Rhythm: Normal rate and regular rhythm.     Heart sounds: Normal heart sounds. No murmur heard.    No friction rub. No gallop.  Pulmonary:     Effort: Pulmonary effort is normal.     Breath sounds: Wheezing (Faint at left base) present.  Abdominal:     Palpations: Abdomen is soft.  Musculoskeletal:     Cervical back: Neck supple.  Lymphadenopathy:     Cervical: No cervical adenopathy.  Skin:    General: Skin is warm and dry.  Neurological:     General: No focal deficit present.     Mental Status: He is alert and oriented to person, place, and time.     Cranial Nerves: No cranial nerve deficit.     Motor: No weakness.    Diagnostic Tests: NUCLEAR MEDICINE PET SKULL BASE TO THIGH   TECHNIQUE: 7.0 mCi F-18 FDG was injected intravenously. Full-ring PET imaging was performed from the skull base to thigh after the radiotracer. CT data was obtained and used for attenuation correction and anatomic localization.   Fasting blood glucose: 98 mg/dl   COMPARISON:  CT chest 07/12/2024, 12/09/2022, 03/27/2022.   FINDINGS: Mediastinal blood pool activity: SUV max 2.2   Liver activity: SUV max NA   NECK:   No abnormal hypermetabolism.    Incidental CT findings:   None.   CHEST:   Posterior left upper lobe mass measures 4.4 x  7.2 cm, SUV max 20.3. Adjacent 9 mm satellite nodule in the medial left upper lobe (4/65), SUV max 6.5. Hypermetabolic left hilar lymph node, SUV max 9.6. No additional abnormal hypermetabolism.   Incidental CT findings:   Atherosclerotic calcification of the aorta, aortic valve and coronary arteries. Heart is at the upper limits of normal in size. No pericardial or pleural effusion. Centrilobular and paraseptal emphysema. Mild surrounding interstitial thickening in the left upper lobe.   ABDOMEN/PELVIS:   No abnormal hypermetabolism.   Incidental CT findings:   Infrarenal aortic aneurysm measures approximately 5.3 cm. Prostate is enlarged.   SKELETON:   No abnormal hypermetabolism.   Incidental CT findings:   Osteopenia.  Degenerative changes in the spine.   IMPRESSION: 1. 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 2. 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 Recommend follow-up every 6 months and vascular consultation. This recommendation follows ACR consensus guidelines: White Paper of the ACR Incidental Findings Committee II on Vascular Findings. J Am Coll Radiol 2013; 10:789-794. 3. Aortic atherosclerosis (ICD10-I70.0). Coronary artery calcification. 4.  Emphysema (ICD10-J43.9).     Electronically Signed   By: Newell Eke M.D.   On: 07/22/2024 12:49 I personally reviewed the CT and PET/CT images.  Large left upper lobe mass with left hilar adenopathy.  Emphysema.  Aortic and coronary calcification.  Infrarenal abdominal aneurysm.  Impression: Austin Lamb is an 83 year old man with a history of tobacco use, lung nodules, hypertension, hyperlipidemia, PAD, AAA, nonsustained VT, reflux, anxiety, depression, and stage I squamous cell  carcinoma of the larynx treated with radiation.  Recently presented with shortness of breath and found to have a large left upper lobe mass.  Left upper lobe mass-4.4 x 7.2 cm.  Hypermetabolic on PET with hilar adenopathy.  Highly suspicious for a primary bronchogenic carcinoma.  Clinical stage IIIa (T4, N1).  Metastatic cancer from his previously treated laryngeal cancer and infection are also in the differential diagnosis but are extremely unlikely.  I recommended that we proceed with bronchoscopy and endobronchial ultrasound for definitive diagnosis and staging.  I am not sure we will be able to reach the notes with endobronchial ultrasound but it is worth an attempt.  I described the postoperative procedure to Mr. Ackerman and his daughters.  I informed them of the general nature of the procedure.  They understand will be done in the operating room under general anesthesia.  Will plan to do it on an outpatient basis.  I informed him of the indications, risks, benefits, and alternatives.  They understand the risks include, but are not limited to death, MI, DVT, and procedure specific risks such as bleeding or airway complications.  He accepts the risk and agrees to proceed.  Abdominal aortic aneurysm-has been followed by Dr. Magda.  Last visit was April 2025.  Radiology measured AAA at 5.3 cm.  Will let Dr. Magda know to see if any additional workup is necessary.  Tobacco use-quit smoking 6 years ago.  Laryngeal cancer-recent follow-up visit showed no evidence of recurrence.  Plan: Bronchoscopy with endobronchial ultrasound on Friday, 07/29/2024 Will alert Dr. Magda to findings in regard to abdominal aortic aneurysm  [name], MD [facility] [phone]

## 2024-07-27 ENCOUNTER — Telehealth: Payer: Self-pay | Admitting: Vascular Surgery

## 2024-07-27 NOTE — Progress Notes (Signed)
 Surgical Instructions   Your procedure is scheduled on Friday, October 17th.. Report to [facility] at 5:30 A.M., then check in with the Admitting office. Any questions or running late day of surgery: call [phone]  Questions prior to your surgery date: call [phone], Monday-Friday, 8am-4pm. If you experience any cold or flu symptoms such as cough, fever, chills, shortness of breath, etc. between now and your scheduled surgery, please notify us  at the above number.     Remember:  Do not eat or drink after midnight the night before your surgery   Take these medicines the morning of surgery with A SIP OF WATER  nebivolol  (BYSTOLIC )  pravastatin (PRAVACHOL)   May take these medicines IF NEEDED: acetaminophen (TYLENOL)  albuterol (VENTOLIN HFA) inhaler - bring with you on day of surgery  diazepam (VALIUM)    Follow your surgeon's instructions on when to stop cilostazol (PLETAL).  If no instructions were given by your surgeon then you will need to call the office to get those instructions.     One week prior to surgery, STOP taking any Aspirin (unless otherwise instructed by your surgeon) Aleve, Naproxen, Ibuprofen, Motrin, Advil, Goody's, BC's, all herbal medications, fish oil, and non-prescription vitamins.                     Do NOT Smoke (Tobacco/Vaping) for 24 hours prior to your procedure.  If you use a CPAP at night, you may bring your mask/headgear for your overnight stay.   You will be asked to remove any contacts, glasses, piercing's, hearing aid's, dentures/partials prior to surgery. Please bring cases for these items if needed.    Patients discharged the day of surgery will not be allowed to drive home, and someone needs to stay with them for 24 hours.  SURGICAL WAITING ROOM VISITATION Patients may have no more than 2 support people in the waiting area - these visitors may rotate.   Pre-op nurse will coordinate an appropriate time for 1 ADULT  support person, who may not rotate, to accompany patient in pre-op.  Children under the age of 61 must have an adult with them who is not the patient and must remain in the main waiting area with an adult.  If the patient needs to stay at the hospital during part of their recovery, the visitor guidelines for inpatient rooms apply.  Please refer to the [facility] website for the visitor guidelines for any additional information.   If you received a COVID test during your pre-op visit  it is requested that you wear a mask when out in public, stay away from anyone that may not be feeling well and notify your surgeon if you develop symptoms. If you have been in contact with anyone that has tested positive in the last 10 days please notify you surgeon.      Pre-operative CHG Bathing Instructions   You can play a key role in reducing the risk of infection after surgery. Your skin needs to be as free of germs as possible. You can reduce the number of germs on your skin by washing with CHG (chlorhexidine  gluconate) soap before surgery. CHG is an antiseptic soap that kills germs and continues to kill germs even after washing.   DO NOT use if you have an allergy to chlorhexidine /CHG or antibacterial soaps. If your skin becomes reddened or irritated, stop using the CHG and notify one of our RNs at [phone].  TAKE A SHOWER THE NIGHT BEFORE SURGERY   Please keep in mind the following:  You may shave your face before/day of surgery.  Place clean sheets on your bed the night before surgery Use a clean washcloth (not used since being washed) for shower. DO NOT sleep with pet's night before surgery.  CHG Shower Instructions:  Wash your face and private area with normal soap. If you choose to wash your hair, wash first with your normal shampoo.  After you use shampoo/soap, rinse your hair and body thoroughly to remove shampoo/soap residue.  Turn the water OFF and apply half the bottle of  CHG soap to a CLEAN washcloth.  Apply CHG soap ONLY FROM YOUR NECK DOWN TO YOUR TOES (washing for 3-5 minutes)  DO NOT use CHG soap on face, private areas, open wounds, or sores.  Pay special attention to the area where your surgery is being performed.  If you are having back surgery, having someone wash your back for you may be helpful. Wait 2 minutes after CHG soap is applied, then you may rinse off the CHG soap.  Pat dry with a clean towel  Put on clean pajamas    Additional instructions for the day of surgery: If you choose, you may shower the morning of surgery with an antibacterial soap.  DO NOT APPLY any lotions, deodorants, or cologne.   Do not wear jewelry Do not bring valuables to the hospital. [facility] is not responsible for valuables/personal belongings. Put on clean/comfortable clothes.  Please brush your teeth.  Ask your nurse before applying any prescription medications to the skin.

## 2024-07-28 ENCOUNTER — Encounter (HOSPITAL_COMMUNITY)
Admission: RE | Admit: 2024-07-28 | Discharge: 2024-07-28 | Disposition: A | Discharge status: Home / Self Care | Source: Ambulatory Visit | Attending: Thoracic Surgery (Cardiothoracic Vascular Surgery) | Admitting: Thoracic Surgery (Cardiothoracic Vascular Surgery)

## 2024-07-28 ENCOUNTER — Encounter (HOSPITAL_COMMUNITY): Payer: Self-pay

## 2024-07-28 ENCOUNTER — Other Ambulatory Visit: Payer: Self-pay

## 2024-07-28 ENCOUNTER — Ambulatory Visit (HOSPITAL_COMMUNITY)
Admission: RE | Admit: 2024-07-28 | Discharge: 2024-07-28 | Disposition: A | Discharge status: Home / Self Care | Source: Ambulatory Visit | Attending: Thoracic Surgery (Cardiothoracic Vascular Surgery) | Admitting: Thoracic Surgery (Cardiothoracic Vascular Surgery)

## 2024-07-28 VITALS — BP 150/99 | HR 89 | Temp 97.9°F | Resp 18 | Ht 68.0 in | Wt 134.7 lb

## 2024-07-28 DIAGNOSIS — C3490 Malignant neoplasm of unspecified part of unspecified bronchus or lung: Secondary | ICD-10-CM | POA: Insufficient documentation

## 2024-07-28 DIAGNOSIS — I447 Left bundle-branch block, unspecified: Secondary | ICD-10-CM | POA: Diagnosis not present

## 2024-07-28 DIAGNOSIS — J439 Emphysema, unspecified: Secondary | ICD-10-CM | POA: Insufficient documentation

## 2024-07-28 DIAGNOSIS — Z01818 Encounter for other preprocedural examination: Secondary | ICD-10-CM | POA: Insufficient documentation

## 2024-07-28 DIAGNOSIS — R918 Other nonspecific abnormal finding of lung field: Secondary | ICD-10-CM | POA: Diagnosis not present

## 2024-07-28 LAB — COMPREHENSIVE METABOLIC PANEL WITH GFR
ALT: 12 U/L (ref 0–44)
AST: 17 U/L (ref 15–41)
Albumin: 3.4 g/dL — ABNORMAL LOW (ref 3.5–5.0)
Alkaline Phosphatase: 109 U/L (ref 38–126)
Anion gap: 9 (ref 5–15)
BUN: 7 mg/dL — ABNORMAL LOW (ref 8–23)
CO2: 31 mmol/L (ref 22–32)
Calcium: 8.8 mg/dL — ABNORMAL LOW (ref 8.9–10.3)
Chloride: 99 mmol/L (ref 98–111)
Creatinine, Ser: 0.87 mg/dL (ref 0.61–1.24)
GFR, Estimated: >60 mL/min (ref >60)
Glucose, Bld: 92 mg/dL (ref 70–99)
Potassium: 4.3 mmol/L (ref 3.5–5.1)
Sodium: 139 mmol/L (ref 135–145)
Total Bilirubin: 0.5 mg/dL (ref 0.0–1.2)
Total Protein: 7.4 g/dL (ref 6.5–8.1)

## 2024-07-28 LAB — CBC
HCT: 43 % (ref 39.0–52.0)
Hemoglobin: 13.6 g/dL (ref 13.0–17.0)
MCH: 27.7 pg (ref 26.0–34.0)
MCHC: 31.6 g/dL (ref 30.0–36.0)
MCV: 87.6 fL (ref 80.0–100.0)
Platelets: 219 K/uL (ref 150–400)
RBC: 4.91 MIL/uL (ref 4.22–5.81)
RDW: 13.5 % (ref 11.5–15.5)
WBC: 6 K/uL (ref 4.0–10.5)
nRBC: 0 % (ref 0.0–0.2)

## 2024-07-28 LAB — PROTIME-INR
INR: 1.1 (ref 0.8–1.2)
Prothrombin Time: 14.8 s (ref 11.4–15.2)

## 2024-07-28 LAB — SURGICAL PCR SCREEN
MRSA, PCR: NEGATIVE
Staphylococcus aureus: NEGATIVE

## 2024-07-28 LAB — APTT: aPTT: 37 s — ABNORMAL HIGH (ref 24–36)

## 2024-07-28 NOTE — Progress Notes (Signed)
 Austin P.A. communicated with [facility] staff and was told Austin Lamb was okay with proceeding with surgery as last dose of cilostazol (pletal) for the patient was 07-28-24.

## 2024-07-28 NOTE — Anesthesia Preprocedure Evaluation (Addendum)
 Anesthesia Evaluation  Patient identified by MRN, date of birth, ID band Patient awake    Reviewed: Allergy & Precautions, NPO status , Patient's Chart, lab work & pertinent test results, reviewed documented beta blocker date and time   Airway Mallampati: II  TM Distance: >3 FB Neck ROM: Full    Dental  (+) Edentulous Upper, Edentulous Lower, Dental Advisory Given   Pulmonary former smoker   Pulmonary exam normal breath sounds clear to auscultation       Cardiovascular hypertension, Pt. on home beta blockers and Pt. on medications + Peripheral Vascular Disease  Normal cardiovascular exam+ dysrhythmias Supra Ventricular Tachycardia  Rhythm:Regular Rate:Normal  TTE 2022 1. Left ventricular ejection fraction, by estimation, is 50 to 55%. The  left ventricle has low normal function. The left ventricle has no regional  wall motion abnormalities. There is mild concentric left ventricular  hypertrophy. Left ventricular  diastolic parameters are consistent with Grade I diastolic dysfunction  (impaired relaxation).   2. Right ventricular systolic function is normal. The right ventricular  size is normal. There is normal pulmonary artery systolic pressure.   3. The mitral valve is normal in structure. Trivial mitral valve  regurgitation. No evidence of mitral stenosis.   4. The aortic valve is tricuspid. Aortic valve regurgitation is not  visualized. No aortic stenosis is present.   5. The inferior vena cava is normal in size with greater than 50%  respiratory variability, suggesting right atrial pressure of 3 mmHg.     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  Reproductive/Obstetrics                              Anesthesia Physical Anesthesia Plan  ASA: 3  Anesthesia Plan: General   Post-op Pain Management: Tylenol PO (pre-op)*   Induction: Intravenous  PONV Risk Score and Plan: 2 and Dexamethasone, Ondansetron and Treatment may vary due to age or medical condition  Airway Management Planned: Oral ETT and Video Laryngoscope Planned  Additional Equipment:   Intra-op Plan:   Post-operative Plan: Extubation in OR  Informed Consent: I have reviewed the patients History and Physical, chart, labs and discussed the procedure including the risks, benefits and alternatives for the proposed anesthesia with the patient or authorized representative who has indicated his/her understanding and acceptance.     Dental advisory given  Plan Discussed with: CRNA  Anesthesia Plan Comments:          Anesthesia Quick Evaluation

## 2024-07-28 NOTE — Progress Notes (Signed)
 PCP - Rutha Cheryl Gander Cardiologist - Dr. Kerrin  PPM/ICD - Denies Device Orders - n/a Rep Notified - n/a  Chest x-ray - 07-28-24 EKG - 07-28-24 Stress Test - denies ECHO - 04-22-21 Cardiac Cath - denies  Sleep Study - denies CPAP - n/a  Non-diabetic  Last dose of GLP1 agonist-  denies GLP1 instructions: n/a  Blood Thinner Instructions: cilostazol (PLETAL)  Aspirin Instructions: denies  ERAS Protcol - NPO PRE-SURGERY Ensure or G2- none  COVID TEST- n/a   Anesthesia review: yes, HTN, PVD  Patient denies shortness of breath, fever, cough and chest pain at PAT appointment.    All instructions explained to the patient, with a verbal understanding of the material. Patient agrees to go over the instructions while at home for a better understanding. Patient also instructed to self quarantine after being tested for COVID-19. The opportunity to ask questions was provided.

## 2024-07-29 ENCOUNTER — Ambulatory Visit (HOSPITAL_COMMUNITY): Payer: Self-pay | Admitting: Anesthesiology

## 2024-07-29 ENCOUNTER — Encounter (HOSPITAL_COMMUNITY)
Admission: RE | Disposition: A | Discharge status: Home / Self Care | Payer: Self-pay | Source: Home / Self Care | Attending: Thoracic Surgery (Cardiothoracic Vascular Surgery)

## 2024-07-29 ENCOUNTER — Ambulatory Visit (HOSPITAL_COMMUNITY)
Admission: RE | Admit: 2024-07-29 | Discharge: 2024-07-29 | Disposition: A | Discharge status: Home / Self Care | Attending: Thoracic Surgery (Cardiothoracic Vascular Surgery) | Admitting: Thoracic Surgery (Cardiothoracic Vascular Surgery)

## 2024-07-29 ENCOUNTER — Ambulatory Visit (HOSPITAL_COMMUNITY)

## 2024-07-29 DIAGNOSIS — F418 Other specified anxiety disorders: Secondary | ICD-10-CM

## 2024-07-29 DIAGNOSIS — I7143 Infrarenal abdominal aortic aneurysm, without rupture: Secondary | ICD-10-CM | POA: Insufficient documentation

## 2024-07-29 DIAGNOSIS — F419 Anxiety disorder, unspecified: Secondary | ICD-10-CM | POA: Diagnosis not present

## 2024-07-29 DIAGNOSIS — Z8521 Personal history of malignant neoplasm of larynx: Secondary | ICD-10-CM | POA: Diagnosis not present

## 2024-07-29 DIAGNOSIS — R911 Solitary pulmonary nodule: Secondary | ICD-10-CM | POA: Diagnosis present

## 2024-07-29 DIAGNOSIS — I471 Supraventricular tachycardia, unspecified: Secondary | ICD-10-CM | POA: Insufficient documentation

## 2024-07-29 DIAGNOSIS — Z87891 Personal history of nicotine dependence: Secondary | ICD-10-CM

## 2024-07-29 DIAGNOSIS — C3412 Malignant neoplasm of upper lobe, left bronchus or lung: Secondary | ICD-10-CM | POA: Diagnosis not present

## 2024-07-29 DIAGNOSIS — Z79899 Other long term (current) drug therapy: Secondary | ICD-10-CM | POA: Insufficient documentation

## 2024-07-29 DIAGNOSIS — I10 Essential (primary) hypertension: Secondary | ICD-10-CM | POA: Diagnosis not present

## 2024-07-29 DIAGNOSIS — I2510 Atherosclerotic heart disease of native coronary artery without angina pectoris: Secondary | ICD-10-CM | POA: Diagnosis not present

## 2024-07-29 DIAGNOSIS — I739 Peripheral vascular disease, unspecified: Secondary | ICD-10-CM | POA: Diagnosis not present

## 2024-07-29 DIAGNOSIS — R918 Other nonspecific abnormal finding of lung field: Secondary | ICD-10-CM

## 2024-07-29 DIAGNOSIS — Z923 Personal history of irradiation: Secondary | ICD-10-CM | POA: Insufficient documentation

## 2024-07-29 DIAGNOSIS — Z809 Family history of malignant neoplasm, unspecified: Secondary | ICD-10-CM | POA: Insufficient documentation

## 2024-07-29 DIAGNOSIS — K219 Gastro-esophageal reflux disease without esophagitis: Secondary | ICD-10-CM | POA: Insufficient documentation

## 2024-07-29 DIAGNOSIS — J439 Emphysema, unspecified: Secondary | ICD-10-CM | POA: Insufficient documentation

## 2024-07-29 DIAGNOSIS — I700 Atherosclerosis of aorta: Secondary | ICD-10-CM | POA: Insufficient documentation

## 2024-07-29 DIAGNOSIS — E785 Hyperlipidemia, unspecified: Secondary | ICD-10-CM | POA: Insufficient documentation

## 2024-07-29 HISTORY — PX: VIDEO BRONCHOSCOPY WITH ENDOBRONCHIAL ULTRASOUND: SHX6177

## 2024-07-29 SURGERY — BRONCHOSCOPY, WITH EBUS
Anesthesia: General

## 2024-07-29 MED ORDER — FENTANYL CITRATE (PF) 250 MCG/5ML IJ SOLN
INTRAMUSCULAR | Status: DC | PRN
  Administered 2024-07-29: 50 ug via INTRAVENOUS

## 2024-07-29 MED ORDER — FENTANYL CITRATE (PF) 100 MCG/2ML IJ SOLN: 25.0000 ug | INTRAMUSCULAR | Status: DC | PRN

## 2024-07-29 MED ORDER — EPINEPHRINE PF 1 MG/ML IJ SOLN
INTRAMUSCULAR | Status: AC
  Filled 2024-07-29: qty 1

## 2024-07-29 MED ORDER — CHLORHEXIDINE GLUCONATE 0.12 % MT SOLN
15.0000 mL | Freq: Once | OROMUCOSAL | Status: AC
  Administered 2024-07-29: 15 mL via OROMUCOSAL
  Filled 2024-07-29: qty 15

## 2024-07-29 MED ORDER — NEBIVOLOL HCL 5 MG PO TABS
5.0000 mg | ORAL_TABLET | Freq: Once | ORAL | Status: AC
  Administered 2024-07-29: 5 mg via ORAL
  Filled 2024-07-29: qty 1

## 2024-07-29 MED ORDER — PHENYLEPHRINE HCL-NACL 20-0.9 MG/250ML-% IV SOLN
INTRAVENOUS | Status: DC | PRN
  Administered 2024-07-29: 50 ug/min via INTRAVENOUS

## 2024-07-29 MED ORDER — ORAL CARE MOUTH RINSE: 15.0000 mL | Freq: Once | OROMUCOSAL | Status: AC

## 2024-07-29 MED ORDER — OXYCODONE HCL 5 MG/5ML PO SOLN: 5.0000 mg | Freq: Once | ORAL | Status: DC | PRN

## 2024-07-29 MED ORDER — OXYCODONE HCL 5 MG PO TABS: 5.0000 mg | ORAL_TABLET | Freq: Once | ORAL | Status: DC | PRN

## 2024-07-29 MED ORDER — AMISULPRIDE (ANTIEMETIC) 5 MG/2ML IV SOLN: 10.0000 mg | Freq: Once | INTRAVENOUS | Status: DC | PRN

## 2024-07-29 MED ORDER — 0.9 % SODIUM CHLORIDE (POUR BTL) OPTIME
TOPICAL | Status: DC | PRN
  Administered 2024-07-29: 1000 mL

## 2024-07-29 MED ORDER — FENTANYL CITRATE (PF) 250 MCG/5ML IJ SOLN
INTRAMUSCULAR | Status: AC
  Filled 2024-07-29: qty 5

## 2024-07-29 MED ORDER — PROPOFOL 10 MG/ML IV BOLUS
INTRAVENOUS | Status: AC
  Filled 2024-07-29: qty 20

## 2024-07-29 MED ORDER — ACETAMINOPHEN 500 MG PO TABS
1000.0000 mg | ORAL_TABLET | Freq: Once | ORAL | Status: AC
  Administered 2024-07-29: 1000 mg via ORAL
  Filled 2024-07-29: qty 2

## 2024-07-29 MED ORDER — PHENYLEPHRINE 80 MCG/ML (10ML) SYRINGE FOR IV PUSH (FOR BLOOD PRESSURE SUPPORT)
PREFILLED_SYRINGE | INTRAVENOUS | Status: DC | PRN
  Administered 2024-07-29: 80 ug via INTRAVENOUS

## 2024-07-29 MED ORDER — SUGAMMADEX SODIUM 200 MG/2ML IV SOLN
INTRAVENOUS | Status: DC | PRN
  Administered 2024-07-29: 200 mg via INTRAVENOUS

## 2024-07-29 MED ORDER — DEXAMETHASONE SOD PHOSPHATE PF 10 MG/ML IJ SOLN
INTRAMUSCULAR | Status: DC | PRN
  Administered 2024-07-29: 10 mg via INTRAVENOUS

## 2024-07-29 MED ORDER — PROPOFOL 500 MG/50ML IV EMUL
INTRAVENOUS | Status: DC | PRN
  Administered 2024-07-29: 100 ug/kg/min via INTRAVENOUS

## 2024-07-29 MED ORDER — LACTATED RINGERS IV SOLN: INTRAVENOUS | Status: DC

## 2024-07-29 MED ORDER — ONDANSETRON HCL 4 MG/2ML IJ SOLN
INTRAMUSCULAR | Status: DC | PRN
  Administered 2024-07-29: 4 mg via INTRAVENOUS

## 2024-07-29 MED ORDER — EPINEPHRINE PF 1 MG/ML IJ SOLN
INTRAMUSCULAR | Status: DC | PRN
  Administered 2024-07-29 (×2): 1 mg via ENDOTRACHEOPULMONARY

## 2024-07-29 MED ORDER — LACTATED RINGERS IV SOLN: INTRAVENOUS | Status: DC | PRN

## 2024-07-29 MED ORDER — ROCURONIUM BROMIDE 10 MG/ML (PF) SYRINGE
PREFILLED_SYRINGE | INTRAVENOUS | Status: DC | PRN
  Administered 2024-07-29: 50 mg via INTRAVENOUS

## 2024-07-29 MED ORDER — LIDOCAINE 2% (20 MG/ML) 5 ML SYRINGE
INTRAMUSCULAR | Status: DC | PRN
  Administered 2024-07-29: 60 mg via INTRAVENOUS

## 2024-07-29 MED ADMIN — PROPOFOL 200 MG/20ML IV EMUL: 70 mg | INTRAVENOUS | NDC 00069020910

## 2024-07-29 SURGICAL SUPPLY — 40 items
ADAPTER VALVE BIOPSY EBUS (MISCELLANEOUS) IMPLANT
BALLOON FOR EBUS SCOPE (BALLOONS) IMPLANT
BRUSH CYTOL CELLEBRITY 1.5X140 (MISCELLANEOUS) IMPLANT
CANISTER SUCTION 3000ML PPV (SUCTIONS) ×1 IMPLANT
CNTNR URN SCR LID CUP LEK RST (MISCELLANEOUS) ×1 IMPLANT
COVER BACK TABLE 60X90IN (DRAPES) ×1 IMPLANT
FILTER STRAW FLUID ASPIR (MISCELLANEOUS) IMPLANT
FORCEPS BIOP RJ4 1.8 (CUTTING FORCEPS) IMPLANT
FORCEPS RADIAL JAW LRG 4 PULM (INSTRUMENTS) IMPLANT
GAUZE 4X4 16PLY ~~LOC~~+RFID DBL (SPONGE) ×1 IMPLANT
GAUZE SPONGE 4X4 12PLY STRL (GAUZE/BANDAGES/DRESSINGS) IMPLANT
GLOVE SS BIOGEL STRL SZ 7.5 (GLOVE) ×2 IMPLANT
GOWN STRL REUS W/ TWL XL LVL3 (GOWN DISPOSABLE) ×1 IMPLANT
KIT CLEAN ENDO COMPLIANCE (KITS) ×2 IMPLANT
KIT TURNOVER KIT B (KITS) ×1 IMPLANT
MARKER SKIN DUAL TIP RULER LAB (MISCELLANEOUS) ×1 IMPLANT
NDL ASPIRATION VIZISHOT 19G (NEEDLE) IMPLANT
NDL ASPIRATION VIZISHOT 21G (NEEDLE) ×1 IMPLANT
NDL BLUNT 18X1 FOR OR ONLY (NEEDLE) IMPLANT
NEEDLE ASPIRATION VIZISHOT 19G (NEEDLE) IMPLANT
NEEDLE ASPIRATION VIZISHOT 21G (NEEDLE) ×1 IMPLANT
NEEDLE BLUNT 18X1 FOR OR ONLY (NEEDLE) IMPLANT
OIL SILICONE PENTAX (PARTS (SERVICE/REPAIRS)) ×1 IMPLANT
PAD ARMBOARD POSITIONER FOAM (MISCELLANEOUS) ×2 IMPLANT
SOLN 0.9% NACL 1000 ML (IV SOLUTION) ×1 IMPLANT
SOLN 0.9% NACL POUR BTL 1000ML (IV SOLUTION) ×1 IMPLANT
SOLN STERILE WATER 1000 ML (IV SOLUTION) ×1 IMPLANT
SOLN STERILE WATER BTL 1000 ML (IV SOLUTION) ×1 IMPLANT
STOPCOCK 4 WAY LG BORE MALE ST (IV SETS) IMPLANT
SYR 20ML ECCENTRIC (SYRINGE) ×2 IMPLANT
SYR 20ML LL LF (SYRINGE) ×1 IMPLANT
SYR 3ML LL SCALE MARK (SYRINGE) IMPLANT
SYR 5ML LL (SYRINGE) ×1 IMPLANT
SYR 5ML LUER SLIP (SYRINGE) ×1 IMPLANT
TOWEL GREEN STERILE (TOWEL DISPOSABLE) ×1 IMPLANT
TOWEL GREEN STERILE FF (TOWEL DISPOSABLE) ×1 IMPLANT
TRAP SPECIMEN MUCUS 40CC (MISCELLANEOUS) ×1 IMPLANT
TUBE CONNECTING 20X1/4 (TUBING) ×1 IMPLANT
VALVE BIOPSY SINGLE USE (MISCELLANEOUS) ×1 IMPLANT
VALVE SUCTION BRONCHIO DISP (MISCELLANEOUS) ×1 IMPLANT

## 2024-07-29 NOTE — Op Note (Signed)
 NAME: LADON, VANDENBERGHE MEDICAL RECORD NO: [id number] ACCOUNT NO: [id number] DATE OF BIRTH: 09-19-1941 FACILITY: [facility] LOCATION: [facility] PHYSICIAN: Elspeth BROCKS. Kerrin, MD  Operative Report   DATE OF PROCEDURE: 07/29/2024  PREOPERATIVE DIAGNOSIS: Left upper lobe mass with hilar adenopathy.  POSTOPERATIVE DIAGNOSES: 1. Left upper lobe mass with hilar adenopathy. 2. 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  PROCEDURE:  Bronchoscopy with brushings and endobronchial and transbronchial biopsies and  Endobronchial ultrasound with transbronchial needle aspirations.  SURGEON: Elspeth BROCKS. Kerrin, MD  ASSISTANT: None.   ANESTHESIA: General.  FINDINGS: Brushing was adequate for diagnosis. Difficult windows on node. Samples showed only blood.  Endobronchial tissue mass in the left upper lobe bronchus. Only benign bronchial cells seen on touch prep.  CLINICAL NOTE: Mr. Amrhein is an 83 year old man with a history of tobacco abuse and lung nodules amongst other medical issues. He was followed for a left upper lobe nodule from the spring of 2022 for 2 years with no progression. The suspicious nodule had initially increased in size but subsequently resolved. He recently began feeling short of breath and chest x-ray showed a left upper lobe mass confirmed by CT and then on PET, there was a 4.4 x 7.2 cm left upper lobe mass with metabolic activity in the left hilum consistent with a T4, N1, stage IIIA non-small cell carcinoma. He was advised to undergo bronchoscopy and endobronchial ultrasound for diagnosis and staging. The indications, risks, benefits, and alternatives were discussed in detail with the patient. He understood and accepted the risks and agreed to proceed.  OPERATIVE NOTE: Mr. Seago was brought to the operating room on 07/29/2024. He had induction of general anesthesia and was intubated. Sequential compression devices were placed on the calves for DVT prophylaxis. A Bair Hugger was  placed for active warming.  A timeout was performed. A flexible fiberoptic bronchoscopy was performed via the endotracheal tube. The trachea and right bronchial tree were within normal limits. On the left side, there was some edema in the left upper lobe airway with blunting of the carina. There was an endobronchial mass of congealed mucus versus tissue in the posterior segmental bronchus of the left upper lobe.  The endobronchial ultrasound scope was placed and was advanced. There was no paratracheal or subcarinal adenopathy of note. The scope was advanced to the left upper lobe bronchus and a level 12 node was identified in close proximity to the pulmonary artery. Needle aspirations were performed using a 21-gauge needle with real-time ultrasound visualization, but samples showed blood. There were no lymphocytes noted. It was not felt that the window was adequate for continued sampling.  The bronchoscope was reinserted. The endobronchial mass was biopsied. There was some mild bleeding. Touch prep showed only benign bronchial cells and blood. Multiple additional samples were taken from that for permanent pathology. There was some bleeding with the biopsies and topical dilute epinephrine  solution was applied. Ultimately, there was good control of bleeding. Brushings then were obtained. Fluoroscopy was used while sampling the mass. The total fluoroscopy time was 1.1 minutes. The total dose was 5 mGy. The initial quick prep on the brushings was adequate for diagnosis. Multiple transbronchial biopsies then were performed with all of the tissue being sent for permanent pathology. At the completion of the sampling, blood was suctioned from the airway. Topical diluted epinephrine  solution was again applied. The bronchoscope was removed. After several minutes, the bronchoscope was reinserted. Inspection revealed no ongoing bleeding. The patient then was extubated in the operating room and taken to the  post-anesthetic care unit in good condition.    [email or web address].[email or web address] D: 07/29/2024 10:09:39 am T: 07/29/2024 10:26:00 am  JOB: [id number]/ [id number]

## 2024-07-29 NOTE — Discharge Instructions (Signed)
 Do not drive or engage in heavy physical activity for 24 hours.  You may resume normal activities tomorrow  You may cough up small amounts of blood over the next few days  You may use acetaminophen (Tylenol) if needed for discomfort.  You may also use an over-the-counter cough medication and/ or throat lozenge if needed.  Call [phone] if you develop chest pain, shortness of breath, fever > 101 F or cough up more than 2 tablespoons of blood.

## 2024-07-29 NOTE — Transfer of Care (Signed)
 Immediate Anesthesia Transfer of Care Note  Patient: [name]  Procedure(s) Performed: BRONCHOSCOPY, WITH EBUS  Patient Location: PACU  Anesthesia Type:General  Level of Consciousness: awake, alert , oriented, and patient cooperative  Airway & Oxygen Therapy: Patient Spontanous Breathing and Patient connected to face mask oxygen  Post-op Assessment: Report given to RN, Post -op Vital signs reviewed and stable, Patient moving all extremities, and Patient moving all extremities X 4  Post vital signs: Reviewed and stable  Last Vitals:  Vitals Value Taken Time  BP 149/80 07/29/24 09:20  Temp 36.8 C 07/29/24 09:05  Pulse 72 07/29/24 09:23  Resp 27 07/29/24 09:23  SpO2 87 % 07/29/24 09:23  Vitals shown include unfiled device data.  Last Pain:  Vitals:   07/29/24 0905  TempSrc:   PainSc: 0-No pain         Complications: No notable events documented.

## 2024-07-29 NOTE — Anesthesia Procedure Notes (Signed)
 Procedure Name: Intubation Date/Time: 07/29/2024 7:46 AM  Performed by: Arvell Edsel HERO, CRNAPre-anesthesia Checklist: Patient identified, Emergency Drugs available, Suction available, Patient being monitored and Timeout performed Patient Re-evaluated:Patient Re-evaluated prior to induction Oxygen Delivery Method: Circle system utilized Preoxygenation: Pre-oxygenation with 100% oxygen Induction Type: IV induction Ventilation: Mask ventilation without difficulty and Oral airway inserted - appropriate to patient size Laryngoscope Size: Mac and 3 Grade View: Grade I Tube size: 8.5 mm Number of attempts: 1 Airway Equipment and Method: Patient positioned with wedge pillow and Stylet Placement Confirmation: ETT inserted through vocal cords under direct vision, positive ETCO2 and breath sounds checked- equal and bilateral Secured at: 23 cm Tube secured with: Tape

## 2024-07-29 NOTE — Brief Op Note (Signed)
 07/29/2024  8:53 AM  PATIENT:  Austin Lamb  83 y.o. male  PRE-OPERATIVE DIAGNOSIS:  LUL mass  POST-OPERATIVE DIAGNOSIS:  LUL mass  PROCEDURE:  Procedure(s): BRONCHOSCOPY, WITH EBUS (N/A) Needle aspirations, brushing, endobronchial and transbronchial biopsies SURGEON:  Surgeons and Role:    * Kerrin Elspeth BROCKS, MD - Primary  PHYSICIAN ASSISTANT:   ASSISTANTS: none   ANESTHESIA:   general  EBL:  minimal  BLOOD ADMINISTERED:none  DRAINS: none   LOCAL MEDICATIONS USED:  NONE  SPECIMEN:  Source of Specimen:  12L node, LUL mass  DISPOSITION OF SPECIMEN:  PATHOLOGY  COUNTS:  NO endo  TOURNIQUET:  * No tourniquets in log *  DICTATION: .Other Dictation: Dictation Number -  PLAN OF CARE: Discharge to home after PACU  PATIENT DISPOSITION:  PACU - hemodynamically stable.   Delay start of Pharmacological VTE agent (>24hrs) due to surgical blood loss or risk of bleeding: not applicable

## 2024-07-29 NOTE — Interval H&P Note (Signed)
 History and Physical Interval Note:  07/29/2024 7:08 AM  Austin Lamb  has presented today for surgery, with the diagnosis of LUL mass.  The various methods of treatment have been discussed with the patient and family. After consideration of risks, benefits and other options for treatment, the patient has consented to  Procedure(s): BRONCHOSCOPY, WITH EBUS (N/A) as a surgical intervention.  The patient's history has been reviewed, patient examined, no change in status, stable for surgery.  I have reviewed the patient's chart and labs.  Questions were answered to the patient's satisfaction.     [name]

## 2024-07-30 NOTE — Anesthesia Postprocedure Evaluation (Signed)
 Anesthesia Post Note  Patient: Austin Lamb  Procedure(s) Performed: BRONCHOSCOPY, WITH EBUS     Patient location during evaluation: PACU Anesthesia Type: General Level of consciousness: awake and alert Pain management: pain level controlled Vital Signs Assessment: post-procedure vital signs reviewed and stable Respiratory status: spontaneous breathing, nonlabored ventilation, respiratory function stable and patient connected to nasal cannula oxygen Cardiovascular status: blood pressure returned to baseline and stable Postop Assessment: no apparent nausea or vomiting Anesthetic complications: no   No notable events documented.  Last Vitals:  Vitals:   07/29/24 0945 07/29/24 1000  BP: 127/84 (!) 158/69  Pulse: 70 68  Resp: (!) 29 (!) 22  Temp:  36.8 C  SpO2: 98% 100%    Last Pain:  Vitals:   07/29/24 0930  TempSrc:   PainSc: 0-No pain                 Elvira Langston L Dionicio Shelnutt

## 2024-07-31 ENCOUNTER — Encounter (HOSPITAL_COMMUNITY): Payer: Self-pay | Admitting: Thoracic Surgery (Cardiothoracic Vascular Surgery)

## 2024-08-08 LAB — CYTOLOGY - NON PAP

## 2024-08-12 ENCOUNTER — Encounter: Payer: Self-pay | Admitting: Vascular Surgery

## 2024-09-05 NOTE — Telephone Encounter (Signed)
 Unable to schedule pt appt.
